# Patient Record
Sex: Female | Born: 1945
Health system: Southern US, Community
[De-identification: ages and names within clinical notes are randomized; demographics above are authoritative.]

## PROBLEM LIST (undated history)

## (undated) DIAGNOSIS — F329 Major depressive disorder, single episode, unspecified: Secondary | ICD-10-CM

## (undated) DIAGNOSIS — F32A Depression, unspecified: Secondary | ICD-10-CM

## (undated) DIAGNOSIS — I10 Essential (primary) hypertension: Secondary | ICD-10-CM

## (undated) DIAGNOSIS — E785 Hyperlipidemia, unspecified: Secondary | ICD-10-CM

## (undated) DIAGNOSIS — M1990 Unspecified osteoarthritis, unspecified site: Secondary | ICD-10-CM

## (undated) DIAGNOSIS — K219 Gastro-esophageal reflux disease without esophagitis: Secondary | ICD-10-CM

## (undated) DIAGNOSIS — Z8719 Personal history of other diseases of the digestive system: Secondary | ICD-10-CM

## (undated) HISTORY — DX: Depression, unspecified: F32.A

## (undated) HISTORY — DX: Essential (primary) hypertension: I10

## (undated) HISTORY — DX: Unspecified osteoarthritis, unspecified site: M19.90

## (undated) HISTORY — PX: CATARACT EXTRACTION, BILATERAL: SHX1313

## (undated) HISTORY — DX: Hyperlipidemia, unspecified: E78.5

## (undated) HISTORY — DX: Major depressive disorder, single episode, unspecified: F32.9

## (undated) HISTORY — DX: Personal history of other diseases of the digestive system: Z87.19

## (undated) HISTORY — DX: Gastro-esophageal reflux disease without esophagitis: K21.9

## (undated) HISTORY — PX: FOOT SURGERY: SHX648

---
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
## 1978-07-10 HISTORY — PX: GANGLION CYST EXCISION: SHX1691

## 1993-07-10 HISTORY — PX: BREAST BIOPSY: SHX20

## 1998-07-10 HISTORY — PX: KNEE ARTHROSCOPY: SUR90

## 2003-04-28 ENCOUNTER — Other Ambulatory Visit: Admission: RE | Admit: 2003-04-28 | Discharge: 2003-04-28 | Payer: Self-pay | Admitting: Radiology

## 2009-11-24 ENCOUNTER — Ambulatory Visit: Payer: Self-pay | Admitting: Internal Medicine

## 2013-04-14 ENCOUNTER — Ambulatory Visit: Payer: Self-pay | Admitting: Ophthalmology

## 2013-04-14 LAB — POTASSIUM: Potassium: 2.9 mmol/L — ABNORMAL LOW (ref 3.5–5.1)

## 2013-04-28 ENCOUNTER — Ambulatory Visit: Payer: Self-pay | Admitting: Ophthalmology

## 2013-07-01 ENCOUNTER — Ambulatory Visit: Payer: Self-pay | Admitting: Ophthalmology

## 2013-07-01 LAB — POTASSIUM: Potassium: 2.9 mmol/L — ABNORMAL LOW (ref 3.5–5.1)

## 2013-07-14 ENCOUNTER — Ambulatory Visit: Payer: Self-pay | Admitting: Ophthalmology

## 2013-07-14 LAB — POTASSIUM: Potassium: 3.8 mmol/L (ref 3.5–5.1)

## 2013-09-08 ENCOUNTER — Ambulatory Visit (INDEPENDENT_AMBULATORY_CARE_PROVIDER_SITE_OTHER): Payer: Medicare Other

## 2013-09-08 ENCOUNTER — Ambulatory Visit (INDEPENDENT_AMBULATORY_CARE_PROVIDER_SITE_OTHER): Payer: Medicare Other | Admitting: Podiatry

## 2013-09-08 ENCOUNTER — Encounter: Payer: Self-pay | Admitting: Podiatry

## 2013-09-08 VITALS — BP 114/68 | HR 96 | Resp 16 | Ht 62.0 in | Wt 150.0 lb

## 2013-09-08 DIAGNOSIS — M79609 Pain in unspecified limb: Secondary | ICD-10-CM

## 2013-09-08 DIAGNOSIS — M79673 Pain in unspecified foot: Secondary | ICD-10-CM

## 2013-09-08 DIAGNOSIS — M21619 Bunion of unspecified foot: Secondary | ICD-10-CM

## 2013-09-08 NOTE — Progress Notes (Signed)
   Subjective:    Patient ID: Christine Mullins, female    DOB: [birth year], 68 y.o.   MRN: [id number]  HPI Comments: i have a corn in between my 4th and 5th toes on my left foot. Ive had it off and on for the last 6 months. They are getting worse. Sometimes shoes will hurt. i have put some acid on my corn and and a pad on it.   Foot Pain      Review of Systems  All other systems reviewed and are negative.       Objective:   Physical Exam I have reviewed her past history medications allergies surgeries and social history. Pulses are strongly palpable bilateral. Neurologic sensorium is intact bilateral. Deep tendon reflexes are intact bilateral. Orthopedic evaluation Mr. is all joints distal ankle a full range of motion the crepitus Taylor's bunion deformity is positive. She also has adductovarus rotated hammertoe deformity fifth digit of the left foot that is irritating the lateral aspect of the fourth digit with a very tight sulcus. Reactive hyperkeratosis is noted the proximal lateral aspect of the fourth digit left foot with soft corn.        Assessment & Plan:  Assessment: Taylor's bunion deformity left foot soft corn between the fourth and fifth digits of the left foot.  Plan: Discussed etiology pathology conservative versus surgical therapies she was treated point from may for surgical consult in a repair.

## 2014-05-04 ENCOUNTER — Ambulatory Visit: Payer: Self-pay | Admitting: Unknown Physician Specialty

## 2014-09-01 DIAGNOSIS — R300 Dysuria: Secondary | ICD-10-CM | POA: Diagnosis not present

## 2014-09-01 DIAGNOSIS — R350 Frequency of micturition: Secondary | ICD-10-CM | POA: Diagnosis not present

## 2014-09-01 DIAGNOSIS — N390 Urinary tract infection, site not specified: Secondary | ICD-10-CM | POA: Diagnosis not present

## 2014-09-01 DIAGNOSIS — E876 Hypokalemia: Secondary | ICD-10-CM | POA: Diagnosis not present

## 2014-10-05 DIAGNOSIS — E876 Hypokalemia: Secondary | ICD-10-CM | POA: Diagnosis not present

## 2014-10-30 NOTE — Op Note (Signed)
PATIENT NAME:  Christine, Mullins MR#:  [id number] DATE OF BIRTH:  08/08/1945  DATE OF PROCEDURE:  04/28/2013  PREOPERATIVE DIAGNOSIS:  Cataract, left eye.    POSTOPERATIVE DIAGNOSIS:  Cataract, left eye.  PROCEDURE PERFORMED:  Extracapsular cataract extraction using phacoemulsification with placement of an Alcon SN6CWS, 18.0-diopter posterior chamber lens, serial # [id number].  SURGEON:  Loura Back. Harlem Bula, MD  ASSISTANT:  None.  ANESTHESIA:  4% lidocaine and 0.75% Marcaine in a 50/50 mixture with 10 units/mL of HyoMax added, given as a peribulbar.  ANESTHESIOLOGIST:  Dr. Benjamine Mola.  COMPLICATIONS:  None.  ESTIMATED BLOOD LOSS:  Less than 1 ml.  DESCRIPTION OF PROCEDURE:  The patient was brought to the operating room and given a peribulbar block.  The patient was then prepped and draped in the usual fashion.  The vertical rectus muscles were imbricated using 5-0 silk sutures.  These sutures were then clamped to the sterile drapes as bridle sutures.  A limbal peritomy was performed extending two clock hours and hemostasis was obtained with cautery.  A partial thickness scleral groove was made at the surgical limbus and dissected anteriorly in a lamellar dissection using an Alcon crescent knife.  The anterior chamber was entered supero-temporally with a Superblade and through the lamellar dissection with a 2.6 mm keratome.  DisCoVisc was used to replace the aqueous and a continuous tear capsulorrhexis was carried out.  Hydrodissection and hydrodelineation were carried out with balanced salt and a 27 gauge canula.  The nucleus was rotated to confirm the effectiveness of the hydrodissection.  Phacoemulsification was carried out using a divide-and-conquer technique.  Total ultrasound time was 1 minute and 11 seconds with an average power of 21.7%, CDE of 28.82.  Irrigation/aspiration was used to remove the residual cortex.  DisCoVisc was used to inflate the capsule and the internal incision was  enlarged to 3 mm with the crescent knife.  The intraocular lens was folded and inserted into the capsular bag using the Ocusert delivery system.  Irrigation/aspiration was used to remove the residual DisCoVisc.  Miostat was injected into the anterior chamber through the paracentesis tract to inflate the anterior chamber and induce miosis.  0.1% mL of cefuroxime was injected via the paracentesis tract containing 1 mg of drug.  The wound was checked for leaks and none were found. The conjunctiva was closed with cautery and the bridle sutures were removed.  Two drops of 0.3% Vigamox were placed on the eye.   An eye shield was placed on the eye.  The patient was discharged to the recovery room in good condition.   ____________________________ Loura Back Narek Kniss, MD sad:dmm D: 04/28/2013 12:27:10 ET T: 04/28/2013 13:11:34 ET JOB#: [id number]  cc: Remo Lipps A. Yaneliz Radebaugh, MD, <Dictator> Martie Lee MD ELECTRONICALLY SIGNED 05/05/2013 13:46

## 2014-10-31 NOTE — Op Note (Signed)
PATIENT NAME:  Christine Mullins, Christine Mullins MR#:  [id number] DATE OF BIRTH:  1946-04-23  DATE OF PROCEDURE:  07/14/2013  PREOPERATIVE DIAGNOSIS: Cataract, right eye.   POSTOPERATIVE DIAGNOSIS: Cataract, right eye.  PROCEDURE PERFORMED: Extracapsular cataract extraction using phacoemulsification with placement of Alcon SN6CWS, 18.5 diopter posterior chamber lens, serial number [id number].   SURGEON: Loura Back. Reilyn Nelson, M.D.   ANESTHESIA: 4% lidocaine and 0.75% Marcaine a 50-50 mixture with 10 units/mL of HyoMax added, given as a peribulbar.   ANESTHESIOLOGIST: Dr. Boston Service.   COMPLICATIONS: None.   ESTIMATED BLOOD LOSS: Less than 1 mL.   DESCRIPTION OF PROCEDURE: The patient was brought to the operating room and given IV sedation and a peribulbar block. She was then prepped and draped in the usual fashion. Vertical rectus muscles were imbricated using 5-0 silk sutures and bridle sutures. The vessels at the limbus were cauterized superiorly using a pencil cautery. A partial thickness scleral groove was made at the posterior surgical limbus and dissected anteriorly into clear cornea with an Alcon  crescent knife. The anterior chamber was entered superonasally through clear cornea with a paracentesis knife and through the lamellar dissection with a 2.6 mm keratome. DisCoVisc was used to place the aqueous and continuous tear circular capsulorrhexis was carried out. Hydrodissection was used to loosen the nucleus and phacoemulsification was carried out in divide and conquer technique. Ultrasound time was 1 minute and an average power of 20.3%, CDE of 19.56. Irrigation-aspiration was used to remove the residual cortex. The capsular bag was inflated with DisCoVisc and the intraocular lens was inserted into the capsular bag using a Librarian, academic. Irrigation-aspiration was used to remove the residual DisCoVisc. The wound was inflated with balanced salt and Miostat was injected through the paracentesis track. A  tenth of a mL of cefuroxime containing 1 mg of drug was injected into the anterior chamber through the paracentesis track. The wound was then checked for leaks. None were found. The bridle sutures were removed. Three drops of Vigamox were placed in the eye. A shield was placed on the eye and the patient was discharged to the recovery area in good condition.   ____________________________ Loura Back Christine Goodhart, Christine Mullins sad:aw D: 07/14/2013 11:51:11 ET T: 07/14/2013 11:59:07 ET JOB#: [id number]  cc: Remo Lipps A. Lyn Deemer, Christine Mullins, <Dictator> Martie Lee Christine Mullins ELECTRONICALLY SIGNED 07/21/2013 13:24

## 2014-12-28 DIAGNOSIS — R21 Rash and other nonspecific skin eruption: Secondary | ICD-10-CM | POA: Diagnosis not present

## 2014-12-28 DIAGNOSIS — L237 Allergic contact dermatitis due to plants, except food: Secondary | ICD-10-CM | POA: Diagnosis not present

## 2015-02-24 DIAGNOSIS — L720 Epidermal cyst: Secondary | ICD-10-CM | POA: Diagnosis not present

## 2015-02-24 DIAGNOSIS — R208 Other disturbances of skin sensation: Secondary | ICD-10-CM | POA: Diagnosis not present

## 2015-03-30 DIAGNOSIS — Z1231 Encounter for screening mammogram for malignant neoplasm of breast: Secondary | ICD-10-CM | POA: Diagnosis not present

## 2015-06-08 DIAGNOSIS — M818 Other osteoporosis without current pathological fracture: Secondary | ICD-10-CM | POA: Diagnosis not present

## 2015-06-08 DIAGNOSIS — R7309 Other abnormal glucose: Secondary | ICD-10-CM | POA: Diagnosis not present

## 2015-06-08 DIAGNOSIS — Z1239 Encounter for other screening for malignant neoplasm of breast: Secondary | ICD-10-CM | POA: Diagnosis not present

## 2015-06-08 DIAGNOSIS — E7800 Pure hypercholesterolemia, unspecified: Secondary | ICD-10-CM | POA: Diagnosis not present

## 2015-06-08 DIAGNOSIS — F5101 Primary insomnia: Secondary | ICD-10-CM | POA: Diagnosis not present

## 2015-06-08 DIAGNOSIS — Z0000 Encounter for general adult medical examination without abnormal findings: Secondary | ICD-10-CM | POA: Diagnosis not present

## 2015-06-08 DIAGNOSIS — E785 Hyperlipidemia, unspecified: Secondary | ICD-10-CM | POA: Diagnosis not present

## 2015-06-08 DIAGNOSIS — I10 Essential (primary) hypertension: Secondary | ICD-10-CM | POA: Diagnosis not present

## 2015-09-27 DIAGNOSIS — J069 Acute upper respiratory infection, unspecified: Secondary | ICD-10-CM | POA: Diagnosis not present

## 2015-09-27 DIAGNOSIS — B9789 Other viral agents as the cause of diseases classified elsewhere: Secondary | ICD-10-CM | POA: Diagnosis not present

## 2015-09-27 DIAGNOSIS — R0982 Postnasal drip: Secondary | ICD-10-CM | POA: Diagnosis not present

## 2015-10-01 ENCOUNTER — Ambulatory Visit: Payer: Self-pay | Admitting: Family Medicine

## 2015-10-06 ENCOUNTER — Ambulatory Visit: Payer: Self-pay | Admitting: Family Medicine

## 2015-10-11 ENCOUNTER — Ambulatory Visit: Payer: Self-pay | Admitting: Family Medicine

## 2016-02-04 DIAGNOSIS — Z961 Presence of intraocular lens: Secondary | ICD-10-CM | POA: Diagnosis not present

## 2016-02-16 DIAGNOSIS — I8393 Asymptomatic varicose veins of bilateral lower extremities: Secondary | ICD-10-CM | POA: Diagnosis not present

## 2016-02-16 DIAGNOSIS — L237 Allergic contact dermatitis due to plants, except food: Secondary | ICD-10-CM | POA: Diagnosis not present

## 2016-02-16 DIAGNOSIS — I781 Nevus, non-neoplastic: Secondary | ICD-10-CM | POA: Diagnosis not present

## 2016-02-16 DIAGNOSIS — L578 Other skin changes due to chronic exposure to nonionizing radiation: Secondary | ICD-10-CM | POA: Diagnosis not present

## 2016-02-16 DIAGNOSIS — D229 Melanocytic nevi, unspecified: Secondary | ICD-10-CM | POA: Diagnosis not present

## 2016-02-16 DIAGNOSIS — L812 Freckles: Secondary | ICD-10-CM | POA: Diagnosis not present

## 2016-02-16 DIAGNOSIS — L821 Other seborrheic keratosis: Secondary | ICD-10-CM | POA: Diagnosis not present

## 2016-02-16 DIAGNOSIS — Z1283 Encounter for screening for malignant neoplasm of skin: Secondary | ICD-10-CM | POA: Diagnosis not present

## 2016-02-16 DIAGNOSIS — D692 Other nonthrombocytopenic purpura: Secondary | ICD-10-CM | POA: Diagnosis not present

## 2016-02-16 DIAGNOSIS — Z85828 Personal history of other malignant neoplasm of skin: Secondary | ICD-10-CM | POA: Diagnosis not present

## 2016-03-22 DIAGNOSIS — M1711 Unilateral primary osteoarthritis, right knee: Secondary | ICD-10-CM | POA: Diagnosis not present

## 2016-03-22 DIAGNOSIS — M25561 Pain in right knee: Secondary | ICD-10-CM | POA: Diagnosis not present

## 2016-04-17 ENCOUNTER — Ambulatory Visit: Payer: Self-pay | Admitting: Internal Medicine

## 2016-04-24 ENCOUNTER — Encounter: Payer: Self-pay | Admitting: Internal Medicine

## 2016-04-24 ENCOUNTER — Ambulatory Visit (INDEPENDENT_AMBULATORY_CARE_PROVIDER_SITE_OTHER): Payer: Medicare Other | Admitting: Internal Medicine

## 2016-04-24 ENCOUNTER — Encounter (INDEPENDENT_AMBULATORY_CARE_PROVIDER_SITE_OTHER): Payer: Self-pay

## 2016-04-24 VITALS — BP 100/58 | HR 82 | Temp 98.0°F | Ht 62.5 in | Wt 153.8 lb

## 2016-04-24 DIAGNOSIS — K5730 Diverticulosis of large intestine without perforation or abscess without bleeding: Secondary | ICD-10-CM

## 2016-04-24 DIAGNOSIS — Z23 Encounter for immunization: Secondary | ICD-10-CM

## 2016-04-24 DIAGNOSIS — I10 Essential (primary) hypertension: Secondary | ICD-10-CM | POA: Diagnosis not present

## 2016-04-24 DIAGNOSIS — E7800 Pure hypercholesterolemia, unspecified: Secondary | ICD-10-CM

## 2016-04-24 DIAGNOSIS — Z1231 Encounter for screening mammogram for malignant neoplasm of breast: Secondary | ICD-10-CM | POA: Diagnosis not present

## 2016-04-24 DIAGNOSIS — M25561 Pain in right knee: Secondary | ICD-10-CM | POA: Diagnosis not present

## 2016-04-24 DIAGNOSIS — K219 Gastro-esophageal reflux disease without esophagitis: Secondary | ICD-10-CM

## 2016-04-24 DIAGNOSIS — Z1239 Encounter for other screening for malignant neoplasm of breast: Secondary | ICD-10-CM

## 2016-04-24 MED ORDER — OMEPRAZOLE 20 MG PO CPDR: 20.0000 mg | DELAYED_RELEASE_CAPSULE | Freq: Every day | ORAL | 3 refills | Status: DC

## 2016-04-24 MED ORDER — ZOLPIDEM TARTRATE 10 MG PO TABS: 10.0000 mg | ORAL_TABLET | Freq: Every evening | ORAL | 0 refills | Status: DC | PRN

## 2016-04-24 NOTE — Progress Notes (Signed)
Patient ID: Christine Mullins, female   DOB: [birth year], 70 y.o.   MRN: [id number]   Subjective:    Patient ID: Christine Mullins, female    DOB: 08-10-1945, 69 y.o.   MRN: [id number]  HPI  Patient here to establish care. Former pt of [facility].  States she was previously followed by Dr Randon Goldsmith and Dr Audie Pinto.  Was out of country for 15 years.  While in [place] in 2014 ad diverticulitis.  Had colonoscopy x 3 while there.  Had never had a flare prior to this.  Since these episodes has had two episodes of diarrhea, but nothing like what she experienced in [place].  Both recent episodes occurred while on a trip or after just returning from a trip.  Bowels ok now.  No abdominal pain or cramping.  No chest pain.  No sob.  Did see Dr Tami Ribas two years ago.  Had cough.  Was told had acid reflux.  Was placed on omeprazole.  Improved.  She does have some right knee pain.  This will limit her sometimes.  Saw Reche Dixon.  Takes mobic prn.  She previously lost 21 pounds with weight watchers.     Past Medical History:  Diagnosis Date  . Arthritis   . Depression   . GERD (gastroesophageal reflux disease)   . Hx of diverticulitis of colon   . Hyperlipidemia   . Hypertension    Past Surgical History:  Procedure Laterality Date  . BREAST BIOPSY  1995   negative  . CESAREAN SECTION    . FOOT SURGERY Bilateral    Family History  Problem Relation Age of Onset  . Arthritis Mother   . Hyperlipidemia Mother   . Hyperlipidemia Father   . Heart failure Father    Social History   Social History  . Marital status: Unknown    Spouse name: N/A  . Number of children: N/A  . Years of education: N/A   Social History Main Topics  . Smoking status: Never Smoker  . Smokeless tobacco: Never Used  . Alcohol use Yes     Comment: socail  . Drug use: Unknown  . Sexual activity: Not Asked   Other Topics Concern  . None   Social History Narrative  . None    Outpatient Encounter Prescriptions as of  04/24/2016  Medication Sig  . aspirin EC 81 MG tablet Take 81 mg by mouth daily.  . benazepril (LOTENSIN) 10 MG tablet   . POTASSIUM CITRATE PO Take by mouth 2 (two) times daily.  . simvastatin (ZOCOR) 40 MG tablet   . omeprazole (PRILOSEC) 20 MG capsule Take 1 capsule (20 mg total) by mouth daily.  Marland Kitchen zolpidem (AMBIEN) 10 MG tablet Take 1 tablet (10 mg total) by mouth at bedtime as needed for sleep.   No facility-administered encounter medications on file as of 04/24/2016.     Review of Systems  Constitutional: Negative for appetite change and unexpected weight change.  HENT: Negative for congestion and sinus pressure.   Respiratory: Negative for cough, chest tightness and shortness of breath.   Cardiovascular: Negative for chest pain, palpitations and leg swelling.  Gastrointestinal: Negative for abdominal pain, diarrhea, nausea and vomiting.  Genitourinary: Negative for difficulty urinating and dysuria.  Musculoskeletal: Negative for back pain.       Right knee pain as outlined.   Skin: Negative for color change and rash.  Neurological: Negative for dizziness, light-headedness and headaches.  Psychiatric/Behavioral: Negative for agitation and  dysphoric mood.       Objective:     Blood pressure rechecked by me:  118/64  Physical Exam  Constitutional: She appears well-developed and well-nourished. No distress.  HENT:  Nose: Nose normal.  Mouth/Throat: Oropharynx is clear and moist.  Neck: Neck supple. No thyromegaly present.  Cardiovascular: Normal rate and regular rhythm.   Pulmonary/Chest: Breath sounds normal. No respiratory distress. She has no wheezes.  Abdominal: Soft. Bowel sounds are normal. There is no tenderness.  Musculoskeletal: She exhibits no edema or tenderness.  Lymphadenopathy:    She has no cervical adenopathy.  Skin: No rash noted. No erythema.  Psychiatric: She has a normal mood and affect. Her behavior is normal.    BP (!) 100/58   Pulse 82   Temp  98 F (36.7 C) (Oral)   Ht 5' 2.5" (1.588 m)   Wt 153 lb 12.8 oz (69.8 kg)   SpO2 97%   BMI 27.68 kg/m  Wt Readings from Last 3 Encounters:  04/24/16 153 lb 12.8 oz (69.8 kg)  09/08/13 150 lb (68 kg)     Lab Results  Component Value Date   K 3.8 07/14/2013       Assessment & Plan:   Problem List Items Addressed This Visit    Diverticulosis of colon without hemorrhage    Had flare in 2014 while in [place].  Had colonoscopy in [place].  She has records.  Will get copy for me.        Essential hypertension    Blood pressure as outlined.  Same medication regimen.  Follow pressures.  Follow metabolic panel.        Relevant Medications   aspirin EC 81 MG tablet   Other Relevant Orders   CBC with Differential/Platelet   Basic metabolic panel   TSH   GERD (gastroesophageal reflux disease)    On omeprazole.  Symptoms controlled.  Follow.        Relevant Medications   omeprazole (PRILOSEC) 20 MG capsule   Hypercholesterolemia    Low cholesterol diet and exercise.  On simvastatin.  Follow lipid panel and liver function tests.        Relevant Medications   aspirin EC 81 MG tablet   Other Relevant Orders   Hepatic function panel   Lipid panel   Right knee pain    Saw ortho.  Was placed on mobic.  Try to limit amount of mobic.  Follow.         Other Visit Diagnoses    Breast cancer screening    -  Primary   Relevant Orders   MM DIGITAL SCREENING BILATERAL   Encounter for immunization       Relevant Medications   aspirin EC 81 MG tablet   POTASSIUM CITRATE PO   zolpidem (AMBIEN) 10 MG tablet   omeprazole (PRILOSEC) 20 MG capsule   Other Relevant Orders   MM DIGITAL SCREENING BILATERAL   Flu vaccine HIGH DOSE PF (Completed)     I spent 45 minutes with the patient and more than 50% of the time was spent in consultation regarding the above.  Time was spent going through her past medical history, current issues and coming up with treatment plans and f/u plans.      Einar Pheasant, MD

## 2016-04-24 NOTE — Progress Notes (Signed)
Pre visit review using our clinic review tool, if applicable. No additional management support is needed unless otherwise documented below in the visit note. 

## 2016-04-30 ENCOUNTER — Encounter: Payer: Self-pay | Admitting: Internal Medicine

## 2016-04-30 DIAGNOSIS — K219 Gastro-esophageal reflux disease without esophagitis: Secondary | ICD-10-CM | POA: Insufficient documentation

## 2016-04-30 DIAGNOSIS — K5730 Diverticulosis of large intestine without perforation or abscess without bleeding: Secondary | ICD-10-CM | POA: Insufficient documentation

## 2016-04-30 DIAGNOSIS — I10 Essential (primary) hypertension: Secondary | ICD-10-CM | POA: Insufficient documentation

## 2016-04-30 DIAGNOSIS — M25561 Pain in right knee: Secondary | ICD-10-CM | POA: Insufficient documentation

## 2016-04-30 DIAGNOSIS — E7800 Pure hypercholesterolemia, unspecified: Secondary | ICD-10-CM | POA: Insufficient documentation

## 2016-04-30 NOTE — Assessment & Plan Note (Signed)
Saw ortho.  Was placed on mobic.  Try to limit amount of mobic.  Follow.

## 2016-04-30 NOTE — Assessment & Plan Note (Signed)
Low cholesterol diet and exercise.  On simvastatin.  Follow lipid panel and liver function tests.

## 2016-04-30 NOTE — Assessment & Plan Note (Signed)
Blood pressure as outlined.  Same medication regimen.  Follow pressures.  Follow metabolic panel.  

## 2016-04-30 NOTE — Assessment & Plan Note (Signed)
On omeprazole.  Symptoms controlled.  Follow.

## 2016-04-30 NOTE — Assessment & Plan Note (Signed)
Had flare in 2014 while in [place].  Had colonoscopy in [place].  She has records.  Will get copy for me.

## 2016-05-01 ENCOUNTER — Other Ambulatory Visit: Payer: Federal, State, Local not specified - PPO

## 2016-05-05 ENCOUNTER — Other Ambulatory Visit (INDEPENDENT_AMBULATORY_CARE_PROVIDER_SITE_OTHER): Payer: Federal, State, Local not specified - PPO

## 2016-05-05 DIAGNOSIS — I10 Essential (primary) hypertension: Secondary | ICD-10-CM | POA: Diagnosis not present

## 2016-05-05 DIAGNOSIS — S8391XD Sprain of unspecified site of right knee, subsequent encounter: Secondary | ICD-10-CM | POA: Diagnosis not present

## 2016-05-05 DIAGNOSIS — E7800 Pure hypercholesterolemia, unspecified: Secondary | ICD-10-CM

## 2016-05-05 LAB — BASIC METABOLIC PANEL
BUN: 15 mg/dL (ref 6–23)
CALCIUM: 9.6 mg/dL (ref 8.4–10.5)
CO2: 28 meq/L (ref 19–32)
CREATININE: 0.72 mg/dL (ref 0.40–1.20)
Chloride: 104 mEq/L (ref 96–112)
GFR: 85.55 mL/min (ref >60.00)
GLUCOSE: 102 mg/dL — AB (ref 70–99)
Potassium: 3.7 mEq/L (ref 3.5–5.1)
Sodium: 140 mEq/L (ref 135–145)

## 2016-05-05 LAB — LIPID PANEL
Cholesterol: 117 mg/dL (ref 0–200)
HDL: 40.7 mg/dL (ref >39.00)
LDL Cholesterol: 56 mg/dL (ref 0–99)
NONHDL: 75.93
TRIGLYCERIDES: 99 mg/dL (ref 0.0–149.0)
Total CHOL/HDL Ratio: 3
VLDL: 19.8 mg/dL (ref 0.0–40.0)

## 2016-05-05 LAB — CBC WITH DIFFERENTIAL/PLATELET
BASOS ABS: 0 10*3/uL (ref 0.0–0.1)
BASOS PCT: 0.2 % (ref 0.0–3.0)
EOS ABS: 0.2 10*3/uL (ref 0.0–0.7)
Eosinophils Relative: 3.2 % (ref 0.0–5.0)
HEMATOCRIT: 41.3 % (ref 36.0–46.0)
Hemoglobin: 14.1 g/dL (ref 12.0–15.0)
LYMPHS ABS: 1.7 10*3/uL (ref 0.7–4.0)
Lymphocytes Relative: 30 % (ref 12.0–46.0)
MCHC: 34 g/dL (ref 30.0–36.0)
MCV: 87 fl (ref 78.0–100.0)
MONO ABS: 0.7 10*3/uL (ref 0.1–1.0)
Monocytes Relative: 11.9 % (ref 3.0–12.0)
NEUTROS ABS: 3.1 10*3/uL (ref 1.4–7.7)
Neutrophils Relative %: 54.7 % (ref 43.0–77.0)
Platelets: 287 10*3/uL (ref 150.0–400.0)
RBC: 4.75 Mil/uL (ref 3.87–5.11)
RDW: 14.5 % (ref 11.5–15.5)
WBC: 5.7 10*3/uL (ref 4.0–10.5)

## 2016-05-05 LAB — HEPATIC FUNCTION PANEL
ALBUMIN: 4.1 g/dL (ref 3.5–5.2)
ALK PHOS: 70 U/L (ref 39–117)
ALT: 18 U/L (ref 0–35)
AST: 21 U/L (ref 0–37)
Bilirubin, Direct: 0.1 mg/dL (ref 0.0–0.3)
TOTAL PROTEIN: 7.4 g/dL (ref 6.0–8.3)
Total Bilirubin: 0.5 mg/dL (ref 0.2–1.2)

## 2016-05-08 LAB — TSH: TSH: 0.77 u[IU]/mL (ref 0.35–4.50)

## 2016-05-09 ENCOUNTER — Telehealth: Payer: Self-pay | Admitting: *Deleted

## 2016-05-09 NOTE — Telephone Encounter (Signed)
Pt has requested lab results Pt contact [phone]

## 2016-05-26 ENCOUNTER — Ambulatory Visit
Admission: RE | Admit: 2016-05-26 | Discharge: 2016-05-26 | Disposition: A | Discharge status: Home / Self Care | Payer: Medicare Other | Source: Ambulatory Visit | Attending: Internal Medicine | Admitting: Internal Medicine

## 2016-05-26 DIAGNOSIS — Z1231 Encounter for screening mammogram for malignant neoplasm of breast: Secondary | ICD-10-CM | POA: Diagnosis not present

## 2016-05-26 DIAGNOSIS — Z1239 Encounter for other screening for malignant neoplasm of breast: Secondary | ICD-10-CM

## 2016-05-30 ENCOUNTER — Other Ambulatory Visit: Payer: Self-pay | Admitting: Internal Medicine

## 2016-06-05 ENCOUNTER — Other Ambulatory Visit: Payer: Self-pay | Admitting: *Deleted

## 2016-06-05 ENCOUNTER — Inpatient Hospital Stay
Admission: RE | Admit: 2016-06-05 | Discharge: 2016-06-05 | Disposition: A | Discharge status: Home / Self Care | Payer: Self-pay | Source: Ambulatory Visit | Attending: *Deleted | Admitting: *Deleted

## 2016-06-05 DIAGNOSIS — Z9289 Personal history of other medical treatment: Secondary | ICD-10-CM

## 2016-06-07 ENCOUNTER — Encounter: Payer: Self-pay | Admitting: Internal Medicine

## 2016-06-07 DIAGNOSIS — Z0000 Encounter for general adult medical examination without abnormal findings: Secondary | ICD-10-CM | POA: Insufficient documentation

## 2016-06-19 ENCOUNTER — Other Ambulatory Visit: Payer: Self-pay | Admitting: Internal Medicine

## 2016-06-19 NOTE — Telephone Encounter (Signed)
Looks like you haven't refill this medication before, would you like to send?

## 2016-06-20 ENCOUNTER — Other Ambulatory Visit: Payer: Self-pay | Admitting: Internal Medicine

## 2016-06-20 NOTE — Telephone Encounter (Signed)
ok'd rx for simvastatin #90 with no refills.

## 2016-06-20 NOTE — Telephone Encounter (Signed)
ok'd rx for ambien #30with no refills.

## 2016-06-20 NOTE — Telephone Encounter (Signed)
Last filled 04/24/16. Last OV 04/24/16. Has CPE on 06/27/16.

## 2016-06-21 NOTE — Telephone Encounter (Signed)
Rx faxed

## 2016-06-27 ENCOUNTER — Ambulatory Visit (INDEPENDENT_AMBULATORY_CARE_PROVIDER_SITE_OTHER): Payer: Medicare Other | Admitting: Internal Medicine

## 2016-06-27 ENCOUNTER — Encounter: Payer: Self-pay | Admitting: Internal Medicine

## 2016-06-27 DIAGNOSIS — K5730 Diverticulosis of large intestine without perforation or abscess without bleeding: Secondary | ICD-10-CM

## 2016-06-27 DIAGNOSIS — K219 Gastro-esophageal reflux disease without esophagitis: Secondary | ICD-10-CM | POA: Diagnosis not present

## 2016-06-27 DIAGNOSIS — M810 Age-related osteoporosis without current pathological fracture: Secondary | ICD-10-CM

## 2016-06-27 DIAGNOSIS — F419 Anxiety disorder, unspecified: Secondary | ICD-10-CM

## 2016-06-27 DIAGNOSIS — M25561 Pain in right knee: Secondary | ICD-10-CM | POA: Diagnosis not present

## 2016-06-27 DIAGNOSIS — E7800 Pure hypercholesterolemia, unspecified: Secondary | ICD-10-CM | POA: Diagnosis not present

## 2016-06-27 DIAGNOSIS — Z0000 Encounter for general adult medical examination without abnormal findings: Secondary | ICD-10-CM

## 2016-06-27 DIAGNOSIS — I10 Essential (primary) hypertension: Secondary | ICD-10-CM

## 2016-06-27 NOTE — Progress Notes (Signed)
Patient ID: Christine Mullins, female   DOB: Jan 09, 1946, 70 y.o.   MRN: [id number]   Subjective:    Patient ID: Christine Mullins, female    DOB: 1945/09/04, 70 y.o.   MRN: [id number]  HPI  Patient with past history of hypercholesterolemia, hypertension and GERD.  She comes in today to follow up on these issues as well as for a complete physical exam.  Doing weight watchers.  Has lost 25 pounds since 01/2016.  Previously had problems with diverticulitis.  See last note.  Had colonoscopy 2013 - diverticulosis, diverticulitis and colon polyp.  Last bad diverticular flare 2014.  No significant problems since.  Had some loose stool last night.  Feels fine today.  Tries to stay active.  No chest pain.  No sob.      Past Medical History:  Diagnosis Date  . Arthritis   . Depression   . GERD (gastroesophageal reflux disease)   . Hx of diverticulitis of colon   . Hyperlipidemia   . Hypertension    Past Surgical History:  Procedure Laterality Date  . BREAST BIOPSY Left 1995   negative  . CESAREAN SECTION    . FOOT SURGERY Bilateral    Family History  Problem Relation Age of Onset  . Arthritis Mother   . Hyperlipidemia Mother   . Hyperlipidemia Father   . Heart failure Father   . Breast cancer Neg Hx    Social History   Social History  . Marital status: Single    Spouse name: N/A  . Number of children: N/A  . Years of education: N/A   Social History Main Topics  . Smoking status: Never Smoker  . Smokeless tobacco: Never Used  . Alcohol use Yes     Comment: socail  . Drug use: Unknown  . Sexual activity: Not Asked   Other Topics Concern  . None   Social History Narrative  . None    Outpatient Encounter Prescriptions as of 06/27/2016  Medication Sig  . aspirin EC 81 MG tablet Take 81 mg by mouth daily.  . benazepril (LOTENSIN) 10 MG tablet TAKE 1 TABLET EVERY DAY  . escitalopram (LEXAPRO) 10 MG tablet TAKE 1 TABLET EVERY DAY  . hydrochlorothiazide (HYDRODIURIL) 25 MG tablet TAKE 1  TABLET EVERY DAY  . omeprazole (PRILOSEC) 20 MG capsule Take 1 capsule (20 mg total) by mouth daily.  Marland Kitchen POTASSIUM CITRATE PO Take by mouth 2 (two) times daily.  . simvastatin (ZOCOR) 40 MG tablet TAKE ONE TABLET EVERY EVENING  . zolpidem (AMBIEN) 10 MG tablet TAKE 1 TALET BY MOUTH AT BEDTIME AS NEEDED FOR SLEEP   No facility-administered encounter medications on file as of 06/27/2016.     Review of Systems  Constitutional: Negative for appetite change and unexpected weight change.  HENT: Negative for congestion and sinus pressure.   Eyes: Negative for pain and visual disturbance.  Respiratory: Negative for cough, chest tightness and shortness of breath.   Cardiovascular: Negative for chest pain, palpitations and leg swelling.  Gastrointestinal: Negative for abdominal pain, diarrhea, nausea and vomiting.  Genitourinary: Negative for difficulty urinating and dysuria.  Musculoskeletal: Negative for back pain and joint swelling.  Skin: Negative for color change and rash.  Neurological: Negative for dizziness, light-headedness and headaches.  Hematological: Negative for adenopathy. Does not bruise/bleed easily.  Psychiatric/Behavioral: Negative for agitation and dysphoric mood.       Objective:    Physical Exam  Constitutional: She is oriented to person, place, and  time. She appears well-developed and well-nourished. No distress.  HENT:  Nose: Nose normal.  Mouth/Throat: Oropharynx is clear and moist.  Eyes: Right eye exhibits no discharge. Left eye exhibits no discharge. No scleral icterus.  Neck: Neck supple. No thyromegaly present.  Cardiovascular: Normal rate and regular rhythm.   Pulmonary/Chest: Breath sounds normal. No accessory muscle usage. No tachypnea. No respiratory distress. She has no decreased breath sounds. She has no wheezes. She has no rhonchi. Right breast exhibits no inverted nipple, no mass, no nipple discharge and no tenderness (no axillary adenopathy). Left breast  exhibits no inverted nipple, no mass, no nipple discharge and no tenderness (no axilarry adenopathy).  Abdominal: Soft. Bowel sounds are normal. There is no tenderness.  Musculoskeletal: She exhibits no edema or tenderness.  Lymphadenopathy:    She has no cervical adenopathy.  Neurological: She is alert and oriented to person, place, and time.  Skin: Skin is warm. No rash noted. No erythema.  Psychiatric: She has a normal mood and affect. Her behavior is normal.    BP 110/72   Pulse 84   Temp 97.6 F (36.4 C) (Oral)   Ht '5\' 3"'$  (1.6 m)   Wt 150 lb 6.4 oz (68.2 kg)   SpO2 96%   BMI 26.64 kg/m  Wt Readings from Last 3 Encounters:  06/27/16 150 lb 6.4 oz (68.2 kg)  04/24/16 153 lb 12.8 oz (69.8 kg)  09/08/13 150 lb (68 kg)     Lab Results  Component Value Date   WBC 5.7 05/05/2016   HGB 14.1 05/05/2016   HCT 41.3 05/05/2016   PLT 287.0 05/05/2016   GLUCOSE 102 (H) 05/05/2016   CHOL 117 05/05/2016   TRIG 99.0 05/05/2016   HDL 40.70 05/05/2016   LDLCALC 56 05/05/2016   ALT 18 05/05/2016   AST 21 05/05/2016   NA 140 05/05/2016   K 3.7 05/05/2016   CL 104 05/05/2016   CREATININE 0.72 05/05/2016   BUN 15 05/05/2016   CO2 28 05/05/2016   TSH 0.77 05/05/2016    Mm Outside Films Mammo  Result Date: 06/05/2016 This examination belongs to an outside facility and is stored here for comparison purposes only.  Contact the originating outside institution for any associated report or interpretation.      Assessment & Plan:   Problem List Items Addressed This Visit    Anxiety    Controlled on lexapro.        Diverticulosis of colon without hemorrhage    Last bad flare in [facility] - 2014.  Colonoscopy as outlined.  Has been doing well.  Follow.  Notify me if flares.        Essential hypertension    Blood pressure under good control.  Continue same medication regimen.  Follow pressures.  Follow metabolic panel.        GERD (gastroesophageal reflux disease)    Controlled  on omeprazole.        Health care maintenance    Physical today 06/27/16.  Mammogram 06/06/16 - Birads I.  Colonoscopy 2013 - diverticulitis, diverticulosis and polyp.  PAP 2012 - negative with negative HPV.  03/23/14 - pap negative.       Hypercholesterolemia    On simvastatin.  Low cholesterol diet and exercise.  Follow lipid panel and liver function tests.        Osteoporosis    Bone density 2012 - documented.        Right knee pain    Saw Dr Joneen Boers  [facility].  S/p injection.  Better.           Einar Pheasant, MD

## 2016-07-10 ENCOUNTER — Encounter: Payer: Self-pay | Admitting: Internal Medicine

## 2016-07-10 DIAGNOSIS — M810 Age-related osteoporosis without current pathological fracture: Secondary | ICD-10-CM | POA: Insufficient documentation

## 2016-07-10 NOTE — Assessment & Plan Note (Signed)
Controlled on omeprazole.   

## 2016-07-10 NOTE — Assessment & Plan Note (Addendum)
Physical today 06/27/16.  Mammogram 06/06/16 - Birads I.  Colonoscopy 2013 - diverticulitis, diverticulosis and polyp.  PAP 2012 - negative with negative HPV.  03/23/14 - pap negative.

## 2016-07-10 NOTE — Assessment & Plan Note (Signed)
Blood pressure under good control.  Continue same medication regimen.  Follow pressures.  Follow metabolic panel.   

## 2016-07-10 NOTE — Assessment & Plan Note (Signed)
On simvastatin.  Low cholesterol diet and exercise.  Follow lipid panel and liver function tests.   

## 2016-07-10 NOTE — Assessment & Plan Note (Signed)
Last bad flare in [facility] - 2014.  Colonoscopy as outlined.  Has been doing well.  Follow.  Notify me if flares.

## 2016-07-10 NOTE — Assessment & Plan Note (Signed)
Controlled on lexapro 

## 2016-07-10 NOTE — Assessment & Plan Note (Signed)
Bone density 2012 - documented.

## 2016-07-10 NOTE — Assessment & Plan Note (Signed)
Saw Dr Leanor Kail.  S/p injection.  Better.

## 2016-07-18 ENCOUNTER — Other Ambulatory Visit: Payer: Self-pay | Admitting: Internal Medicine

## 2016-07-19 NOTE — Telephone Encounter (Signed)
Christine Mullins was last refilled on 06/20/16 #30, last OV 06/27/16. Ok to refill?

## 2016-07-20 NOTE — Telephone Encounter (Signed)
Do you know if this rx was faxed or do I need to call it in?

## 2016-08-02 DIAGNOSIS — Z961 Presence of intraocular lens: Secondary | ICD-10-CM | POA: Diagnosis not present

## 2016-08-08 ENCOUNTER — Telehealth: Payer: Self-pay | Admitting: Internal Medicine

## 2016-08-08 NOTE — Telephone Encounter (Signed)
Spoke with patient and scheduled appointment with Christine Paris, FNP tomorrow.

## 2016-08-08 NOTE — Telephone Encounter (Signed)
Pt called and stated that she has laryngitis, cough, cough so hard she had some blood, ear pain and runny nose. Pt does not think that she has the flu, believes it may be bronchitis. Pt would like some advise as far as what to do. Please advise, thank yo!  Call pt @ [phone]

## 2016-08-09 ENCOUNTER — Encounter: Payer: Self-pay | Admitting: Family

## 2016-08-09 ENCOUNTER — Ambulatory Visit (INDEPENDENT_AMBULATORY_CARE_PROVIDER_SITE_OTHER): Payer: Medicare Other | Admitting: Family

## 2016-08-09 VITALS — BP 110/68 | HR 98 | Temp 97.4°F | Ht 63.0 in | Wt 149.4 lb

## 2016-08-09 DIAGNOSIS — J040 Acute laryngitis: Secondary | ICD-10-CM

## 2016-08-09 MED ORDER — HYDROCODONE-HOMATROPINE 5-1.5 MG/5ML PO SYRP: 5.0000 mL | ORAL_SOLUTION | Freq: Every evening | ORAL | 0 refills | Status: DC | PRN

## 2016-08-09 MED ORDER — LIDOCAINE VISCOUS 2 % MT SOLN: 15.0000 mL | OROMUCOSAL | 0 refills | Status: DC | PRN

## 2016-08-09 NOTE — Progress Notes (Signed)
Pre visit review using our clinic review tool, if applicable. No additional management support is needed unless otherwise documented below in the visit note. 

## 2016-08-09 NOTE — Patient Instructions (Signed)
Viscous lidocaine   As discussed, suspect viral laryngitis; please let me know if not better.   Please take cough medication at night only as needed. As we discussed, I do not recommend dosing throughout the day as coughing is a protective mechanism . It also helps to break up thick mucous.  Do not take cough suppressants with alcohol as can lead to trouble breathing. Advise caution if taking cough suppressant and operating machinery ( i.e driving a car) as you may feel very tired.   Increase intake of clear fluids. Congestion is best treated by hydration, when mucus is wetter, it is thinner, less sticky, and easier to expel from the body, either through coughing up drainage, or by blowing your nose.   Get plenty of rest.   Use saline nasal drops and blow your nose frequently. Run a humidifier at night and elevate the head of the bed. Vicks Vapor rub will help with congestion and cough. Steam showers and sinus massage for congestion.   Use Acetaminophen or Ibuprofen as needed for fever or pain. Avoid second hand smoke. Even the smallest exposure will worsen symptoms.   Over the counter medications you can try include Delsym for cough, a decongestant for congestion, and Mucinex or Robitussin as an expectorant. Be sure to just get the plain Mucinex or Robitussin that just has one medication (Guaifenesen). We don't recommend the combination products. Note, be sure to drink two glasses of water with each dose of Mucinex as the medication will not work well without adequate hydration.   You can also try a teaspoon of honey to see if this will help reduce cough. Throat lozenges can sometimes be beneficial as well.    This illness will typically last 7 - 10 days.   Please follow up with our clinic if you develop a fever greater than 101 F, symptoms worsen, or do not resolve in the next week.

## 2016-08-09 NOTE — Progress Notes (Signed)
Subjective:    Patient ID: Christine Mullins, female    DOB: [birth year], 71 y.o.   MRN: [id number]  CC: Christine Mullins is a 71 y.o. female who presents today for an acute visit.    HPI: CC: loss her voice x 4 days, worsening. Endorses productive blood tinged sputum, cough, sore throat.   No SOB, wheezing, fever, chills.   Has been taking delsym with no relief.     HISTORY:  Past Medical History:  Diagnosis Date  . Arthritis   . Depression   . GERD (gastroesophageal reflux disease)   . Hx of diverticulitis of colon   . Hyperlipidemia   . Hypertension    Past Surgical History:  Procedure Laterality Date  . BREAST BIOPSY Left 1995   negative  . CESAREAN SECTION    . FOOT SURGERY Bilateral    Family History  Problem Relation Age of Onset  . Arthritis Mother   . Hyperlipidemia Mother   . Hyperlipidemia Father   . Heart failure Father   . Breast cancer Neg Hx     Allergies: Patient has no known allergies. Current Outpatient Prescriptions on File Prior to Visit  Medication Sig Dispense Refill  . aspirin EC 81 MG tablet Take 81 mg by mouth daily.    . benazepril (LOTENSIN) 10 MG tablet TAKE 1 TABLET EVERY DAY 90 tablet 1  . escitalopram (LEXAPRO) 10 MG tablet TAKE 1 TABLET EVERY DAY 90 tablet 1  . hydrochlorothiazide (HYDRODIURIL) 25 MG tablet TAKE 1 TABLET EVERY DAY 90 tablet 1  . omeprazole (PRILOSEC) 20 MG capsule Take 1 capsule (20 mg total) by mouth daily. 30 capsule 3  . simvastatin (ZOCOR) 40 MG tablet TAKE 1 TABLET BY MOUTH EVERY EVENING 90 tablet 0  . zolpidem (AMBIEN) 10 MG tablet TAKE 1 TABLET BY MOUTH AT BEDTIME AS NEEDED FOR SLEEP 30 tablet 0   No current facility-administered medications on file prior to visit.     Social History  Substance Use Topics  . Smoking status: Never Smoker  . Smokeless tobacco: Never Used  . Alcohol use Yes     Comment: socail    Review of Systems  Constitutional: Negative for chills and fever.  HENT: Positive for congestion,  sore throat and voice change. Negative for sinus pain and sinus pressure.   Respiratory: Positive for cough. Negative for shortness of breath and wheezing.   Cardiovascular: Negative for chest pain and palpitations.  Gastrointestinal: Negative for nausea and vomiting.      Objective:    BP 110/68   Pulse 98   Temp 97.4 F (36.3 C) (Oral)   Ht '5\' 3"'$  (1.6 m)   Wt 149 lb 6.4 oz (67.8 kg)   SpO2 97%   BMI 26.47 kg/m    Physical Exam  Constitutional: She appears well-developed and well-nourished.  HENT:  Head: Normocephalic and atraumatic.  Right Ear: Hearing, tympanic membrane, external ear and ear canal normal. No drainage, swelling or tenderness. No foreign bodies. Tympanic membrane is not erythematous and not bulging. No middle ear effusion. No decreased hearing is noted.  Left Ear: Hearing, tympanic membrane, external ear and ear canal normal. No drainage, swelling or tenderness. No foreign bodies. Tympanic membrane is not erythematous and not bulging.  No middle ear effusion. No decreased hearing is noted.  Nose: Nose normal. No rhinorrhea. Right sinus exhibits no maxillary sinus tenderness and no frontal sinus tenderness. Left sinus exhibits no maxillary sinus tenderness and no frontal sinus  tenderness.  Mouth/Throat: Uvula is midline and mucous membranes are normal. Posterior oropharyngeal erythema present. No oropharyngeal exudate, posterior oropharyngeal edema or tonsillar abscesses.  Eyes: Conjunctivae are normal.  Cardiovascular: Regular rhythm, normal heart sounds and normal pulses.   Pulmonary/Chest: Effort normal and breath sounds normal. She has no wheezes. She has no rhonchi. She has no rales.  Lymphadenopathy:       Head (right side): No submental, no submandibular, no tonsillar, no preauricular, no posterior auricular and no occipital adenopathy present.       Head (left side): No submental, no submandibular, no tonsillar, no preauricular, no posterior auricular and no  occipital adenopathy present.    She has no cervical adenopathy.  Neurological: She is alert.  Skin: Skin is warm and dry.  Psychiatric: She has a normal mood and affect. Her speech is normal and behavior is normal. Thought content normal.  Vitals reviewed.      Assessment & Plan:  1. Laryngitis Afebrile afebrile. No adventitious lung sounds. Patient and I discussed most likely viral etiology with duration of 4 days. She agreed with conservative management and delayed antibiotic use. When necessary cough medication. She will let me know if she's not better.  - HYDROcodone-homatropine (HYCODAN) 5-1.5 MG/5ML syrup; Take 5 mLs by mouth at bedtime as needed for cough.  Dispense: 120 mL; Refill: 0 - lidocaine (XYLOCAINE) 2 % solution; Use as directed 15 mLs in the mouth or throat every 3 (three) hours as needed for mouth pain (gargle; may spit or swallow).  Dispense: 100 mL; Refill: 0     I have discontinued Ms. Basista's POTASSIUM CITRATE PO. I am also having her start on HYDROcodone-homatropine and lidocaine. Additionally, I am having her maintain her aspirin EC, omeprazole, escitalopram, benazepril, hydrochlorothiazide, zolpidem, and simvastatin.   Meds ordered this encounter  Medications  . HYDROcodone-homatropine (HYCODAN) 5-1.5 MG/5ML syrup    Sig: Take 5 mLs by mouth at bedtime as needed for cough.    Dispense:  120 mL    Refill:  0    Order Specific Question:   Supervising Provider    Answer:   Deborra Medina L [[id number]]  . lidocaine (XYLOCAINE) 2 % solution    Sig: Use as directed 15 mLs in the mouth or throat every 3 (three) hours as needed for mouth pain (gargle; may spit or swallow).    Dispense:  100 mL    Refill:  0    Order Specific Question:   Supervising Provider    Answer:   Crecencio Mc [[id number]]    Return precautions given.   Risks, benefits, and alternatives of the medications and treatment plan prescribed today were discussed, and patient expressed understanding.    Education regarding symptom management and diagnosis given to patient on AVS.  Continue to follow with Einar Pheasant, MD for routine health maintenance.   [name] and I agreed with plan.   Mable Paris, FNP

## 2016-08-14 ENCOUNTER — Telehealth: Payer: Self-pay | Admitting: *Deleted

## 2016-08-14 NOTE — Telephone Encounter (Signed)
Pt has requested a update

## 2016-08-14 NOTE — Telephone Encounter (Signed)
Pt has requested a medication refill for lidocaine and hycodan. Pt stated that she continues to have a productive cough  [facility]

## 2016-08-15 ENCOUNTER — Other Ambulatory Visit: Payer: Self-pay | Admitting: Family

## 2016-08-15 ENCOUNTER — Telehealth: Payer: Self-pay | Admitting: *Deleted

## 2016-08-15 DIAGNOSIS — J040 Acute laryngitis: Secondary | ICD-10-CM

## 2016-08-15 MED ORDER — LIDOCAINE VISCOUS 2 % MT SOLN: 15.0000 mL | OROMUCOSAL | 0 refills | Status: DC | PRN

## 2016-08-15 MED ORDER — HYDROCODONE-HOMATROPINE 5-1.5 MG/5ML PO SYRP: 5.0000 mL | ORAL_SOLUTION | Freq: Every evening | ORAL | 0 refills | Status: DC | PRN

## 2016-08-15 NOTE — Telephone Encounter (Signed)
Pt has requested another round of cough medication and lidocaine . Pt currently have symptoms of congestion and cough, pt has a cough with green mucus Pt contact [phone]

## 2016-08-15 NOTE — Telephone Encounter (Signed)
Please advise. Pt was last seen on 08/09/16 by Arnett. She also sent a message yesterday with same request. (PCP: Einar Pheasant)

## 2016-08-15 NOTE — Telephone Encounter (Signed)
Call pt-  Ordered. If persists or patient has new symptoms, please let me know, she may need antibiotic

## 2016-08-15 NOTE — Telephone Encounter (Signed)
See other phone note for follow-up

## 2016-08-16 NOTE — Telephone Encounter (Signed)
Left message for patient to return call back.  

## 2016-08-18 NOTE — Telephone Encounter (Signed)
Patient has received medication and feels a little better now.

## 2016-09-28 DIAGNOSIS — M1711 Unilateral primary osteoarthritis, right knee: Secondary | ICD-10-CM | POA: Diagnosis not present

## 2016-09-28 DIAGNOSIS — M7541 Impingement syndrome of right shoulder: Secondary | ICD-10-CM | POA: Diagnosis not present

## 2016-10-16 ENCOUNTER — Telehealth: Payer: Self-pay | Admitting: Internal Medicine

## 2016-10-16 ENCOUNTER — Other Ambulatory Visit: Payer: Self-pay | Admitting: Internal Medicine

## 2016-10-16 MED ORDER — ZOLPIDEM TARTRATE 10 MG PO TABS: 10.0000 mg | ORAL_TABLET | Freq: Every evening | ORAL | 0 refills | Status: DC | PRN

## 2016-10-16 NOTE — Telephone Encounter (Signed)
Pt called regarding needing to speak to nurse about her Rx for zolpidem (AMBIEN) 10 MG tablet. Has no refill on it.   Pharmacy is [facility], [place]  Call pt @ [phone]. Thank you!

## 2016-10-16 NOTE — Telephone Encounter (Signed)
rx ok'd for ambien #30 with no refills.   

## 2016-10-16 NOTE — Telephone Encounter (Signed)
Medication: Ambien '10mg'$  Directions: 1 op qhs Last given: 07-19-16 #30 Number refills: 0 Last o/v: 06-27-16 Follow up: n/a

## 2016-10-17 NOTE — Telephone Encounter (Signed)
Faxed to pharmacy

## 2016-11-16 ENCOUNTER — Telehealth: Payer: Self-pay

## 2016-11-16 NOTE — Telephone Encounter (Signed)
Patient walked into the office, complaints of increased anxiety due to a wedding next week, quite ill family member, and her responsibility level has gone up.  She is not sleeping and is having a difficult time coping.  Having trouble organizing and no appetite.  She has lost 3 pounds in one week and having diarrhea.  I advised that we had no appts today, but that Dr. Nicki Reaper could see her tomorrow.  Scheduled for open appt tomorrow, last OV in chart was 06/27/16 with PCP has been seen acutely and on 4/9 was prescribed ambien.

## 2016-11-17 ENCOUNTER — Telehealth: Payer: Self-pay | Admitting: *Deleted

## 2016-11-17 ENCOUNTER — Encounter: Payer: Self-pay | Admitting: Internal Medicine

## 2016-11-17 ENCOUNTER — Ambulatory Visit (INDEPENDENT_AMBULATORY_CARE_PROVIDER_SITE_OTHER): Payer: Medicare Other | Admitting: Internal Medicine

## 2016-11-17 DIAGNOSIS — F419 Anxiety disorder, unspecified: Secondary | ICD-10-CM

## 2016-11-17 DIAGNOSIS — I10 Essential (primary) hypertension: Secondary | ICD-10-CM | POA: Diagnosis not present

## 2016-11-17 MED ORDER — ALPRAZOLAM 0.25 MG PO TABS: 0.2500 mg | ORAL_TABLET | Freq: Two times a day (BID) | ORAL | 0 refills | Status: DC | PRN

## 2016-11-17 NOTE — Progress Notes (Signed)
Patient ID: Christine Mullins, female   DOB: Nov 14, 1945, 71 y.o.   MRN: [id number]   Subjective:    Patient ID: Christine Mullins, female    DOB: 19-May-1946, 71 y.o.   MRN: [id number]  HPI  Patient here as a work in with concerns regarding increased stress and anxiety.  She reports that she is having to do a lot for her son's wedding.  The wedding is in [place].  Her future daughter-n-law's mother and grandmother are very sick.  She reports the stress has increased recently.  Feels needs something to help her calm down.  No chest pain.  She stays active.  When she walks and exercises, no chest pain or tightness.  Feels better.  No sob.  No acid reflux.  Has lost more weight.  Not eating well.  Not sleeping well.  Feels needs something to help her relax.  She is taking [place].  On lexapro.     Past Medical History:  Diagnosis Date  . Arthritis   . Depression   . GERD (gastroesophageal reflux disease)   . Hx of diverticulitis of colon   . Hyperlipidemia   . Hypertension    Past Surgical History:  Procedure Laterality Date  . BREAST BIOPSY Left 1995   negative  . CESAREAN SECTION    . FOOT SURGERY Bilateral    Family History  Problem Relation Age of Onset  . Arthritis Mother   . Hyperlipidemia Mother   . Hyperlipidemia Father   . Heart failure Father   . Breast cancer Neg Hx    Social History   Social History  . Marital status: Single    Spouse name: N/A  . Number of children: N/A  . Years of education: N/A   Social History Main Topics  . Smoking status: Never Smoker  . Smokeless tobacco: Never Used  . Alcohol use Yes     Comment: socail  . Drug use: Unknown  . Sexual activity: Not Asked   Other Topics Concern  . None   Social History Narrative  . None    Outpatient Encounter Prescriptions as of 11/17/2016  Medication Sig  . aspirin EC 81 MG tablet Take 81 mg by mouth daily.  . benazepril (LOTENSIN) 10 MG tablet TAKE 1 TABLET EVERY DAY  . escitalopram (LEXAPRO) 10 MG  tablet TAKE 1 TABLET EVERY DAY  . hydrochlorothiazide (HYDRODIURIL) 25 MG tablet TAKE 1 TABLET EVERY DAY  . omeprazole (PRILOSEC) 20 MG capsule Take 1 capsule (20 mg total) by mouth daily.  . simvastatin (ZOCOR) 40 MG tablet TAKE 1 TABLET BY MOUTH EVERY EVENING  . zolpidem (AMBIEN) 10 MG tablet Take 1 tablet (10 mg total) by mouth at bedtime as needed. for sleep  . ALPRAZolam (XANAX) 0.25 MG tablet Take 1 tablet (0.25 mg total) by mouth 2 (two) times daily as needed for anxiety.  . [DISCONTINUED] HYDROcodone-homatropine (HYCODAN) 5-1.5 MG/5ML syrup Take 5 mLs by mouth at bedtime as needed for cough.  . [DISCONTINUED] lidocaine (XYLOCAINE) 2 % solution Use as directed 15 mLs in the mouth or throat every 3 (three) hours as needed for mouth pain (gargle; may spit or swallow).   No facility-administered encounter medications on file as of 11/17/2016.     Review of Systems  Constitutional:       Some decreased appetite.  Has lost weight.    HENT: Negative for congestion and sinus pressure.   Respiratory: Negative for cough, chest tightness and shortness of breath.  Cardiovascular: Negative for chest pain, palpitations and leg swelling.  Gastrointestinal: Negative for abdominal pain, nausea and vomiting.       Some bowel change at times, which she attributes to increased stress.    Genitourinary: Negative for difficulty urinating and dysuria.  Musculoskeletal: Negative for back pain and joint swelling.  Skin: Negative for color change and rash.  Neurological: Negative for dizziness, light-headedness and headaches.  Psychiatric/Behavioral: Positive for sleep disturbance. Negative for dysphoric mood.       Increased stress as outlined.        Objective:     Blood pressure rechecked by me:  114/68  Physical Exam  Constitutional: She appears well-developed and well-nourished. No distress.  HENT:  Nose: Nose normal.  Mouth/Throat: Oropharynx is clear and moist.  Neck: Neck supple. No  thyromegaly present.  Cardiovascular: Normal rate and regular rhythm.   Pulmonary/Chest: Breath sounds normal. No respiratory distress. She has no wheezes.  Abdominal: Soft. Bowel sounds are normal. There is no tenderness.  Musculoskeletal: She exhibits no edema or tenderness.  Lymphadenopathy:    She has no cervical adenopathy.  Skin: No rash noted. No erythema.  Psychiatric: She has a normal mood and affect. Her behavior is normal.    BP 114/68   Pulse 76   Temp 98.4 F (36.9 C) (Oral)   Resp 12   Ht '5\' 2"'$  (1.575 m)   Wt 140 lb 6.4 oz (63.7 kg)   SpO2 98%   BMI 25.68 kg/m  Wt Readings from Last 3 Encounters:  11/17/16 140 lb 6.4 oz (63.7 kg)  08/09/16 149 lb 6.4 oz (67.8 kg)  06/27/16 150 lb 6.4 oz (68.2 kg)     Lab Results  Component Value Date   WBC 5.7 05/05/2016   HGB 14.1 05/05/2016   HCT 41.3 05/05/2016   PLT 287.0 05/05/2016   GLUCOSE 102 (H) 05/05/2016   CHOL 117 05/05/2016   TRIG 99.0 05/05/2016   HDL 40.70 05/05/2016   LDLCALC 56 05/05/2016   ALT 18 05/05/2016   AST 21 05/05/2016   NA 140 05/05/2016   K 3.7 05/05/2016   CL 104 05/05/2016   CREATININE 0.72 05/05/2016   BUN 15 05/05/2016   CO2 28 05/05/2016   TSH 0.77 05/05/2016    Mm Outside Films Mammo  Result Date: 06/05/2016 This examination belongs to an outside facility and is stored here for comparison purposes only.  Contact the originating outside institution for any associated report or interpretation.      Assessment & Plan:   Problem List Items Addressed This Visit    Anxiety    Increased stress and anxiety as outlined.  Some sleep disturbance as outlined.  Has been trying [place] recently.  Continue lexapro.  States feels better after talking today.  Xanax as directed.  Discussed xanax and proper way to take the medication.  Do not take with ambien.  Follow.        Relevant Medications   ALPRAZolam (XANAX) 0.25 MG tablet   Essential hypertension    Blood pressure on recheck  improved.  Follow.            Einar Pheasant, MD

## 2016-11-17 NOTE — Telephone Encounter (Signed)
Patient will need a follow up in 8 weeks -thanks  Pt contact [phone]

## 2016-11-17 NOTE — Progress Notes (Signed)
Pre-visit discussion using our clinic review tool. No additional management support is needed unless otherwise documented below in the visit note.  

## 2016-11-18 ENCOUNTER — Encounter: Payer: Self-pay | Admitting: Internal Medicine

## 2016-11-18 NOTE — Assessment & Plan Note (Signed)
Blood pressure on recheck improved.  Follow.

## 2016-11-18 NOTE — Assessment & Plan Note (Signed)
Increased stress and anxiety as outlined.  Some sleep disturbance as outlined.  Has been trying [place] recently.  Continue lexapro.  States feels better after talking today.  Xanax as directed.  Discussed xanax and proper way to take the medication.  Do not take with ambien.  Follow.

## 2016-11-24 NOTE — Telephone Encounter (Signed)
Lm on vm to schedule an appt around 01/19/17

## 2016-11-29 NOTE — Telephone Encounter (Signed)
Lm for pt to call back and schedule.

## 2016-11-29 NOTE — Telephone Encounter (Signed)
Patient has requested a call to schedule  [phone]

## 2016-12-01 ENCOUNTER — Other Ambulatory Visit: Payer: Self-pay | Admitting: Internal Medicine

## 2016-12-28 ENCOUNTER — Telehealth: Payer: Self-pay | Admitting: Internal Medicine

## 2017-01-05 ENCOUNTER — Telehealth: Payer: Self-pay | Admitting: Internal Medicine

## 2017-01-05 MED ORDER — ZOLPIDEM TARTRATE 10 MG PO TABS: 10.0000 mg | ORAL_TABLET | Freq: Every evening | ORAL | 0 refills | Status: DC | PRN

## 2017-01-05 NOTE — Telephone Encounter (Signed)
ok'd ambien #30 with no refills.   

## 2017-01-05 NOTE — Telephone Encounter (Signed)
F/u 02/23/17 Last fill 11/17/16 #30 no refill

## 2017-01-05 NOTE — Telephone Encounter (Signed)
Patient needing a refill on her zolpidem (AMBIEN) 10 MG tablet

## 2017-01-08 NOTE — Telephone Encounter (Signed)
Faxed to pharmacy

## 2017-02-06 DIAGNOSIS — H43822 Vitreomacular adhesion, left eye: Secondary | ICD-10-CM | POA: Diagnosis not present

## 2017-02-19 DIAGNOSIS — D1800 Hemangioma unspecified site: Secondary | ICD-10-CM | POA: Diagnosis not present

## 2017-02-19 DIAGNOSIS — D229 Melanocytic nevi, unspecified: Secondary | ICD-10-CM | POA: Diagnosis not present

## 2017-02-19 DIAGNOSIS — L578 Other skin changes due to chronic exposure to nonionizing radiation: Secondary | ICD-10-CM | POA: Diagnosis not present

## 2017-02-19 DIAGNOSIS — D692 Other nonthrombocytopenic purpura: Secondary | ICD-10-CM | POA: Diagnosis not present

## 2017-02-19 DIAGNOSIS — L821 Other seborrheic keratosis: Secondary | ICD-10-CM | POA: Diagnosis not present

## 2017-02-19 DIAGNOSIS — Z85828 Personal history of other malignant neoplasm of skin: Secondary | ICD-10-CM | POA: Diagnosis not present

## 2017-02-19 DIAGNOSIS — L812 Freckles: Secondary | ICD-10-CM | POA: Diagnosis not present

## 2017-02-23 ENCOUNTER — Encounter: Payer: Self-pay | Admitting: Internal Medicine

## 2017-02-23 ENCOUNTER — Ambulatory Visit (INDEPENDENT_AMBULATORY_CARE_PROVIDER_SITE_OTHER): Payer: Medicare Other | Admitting: Internal Medicine

## 2017-02-23 DIAGNOSIS — E7800 Pure hypercholesterolemia, unspecified: Secondary | ICD-10-CM | POA: Diagnosis not present

## 2017-02-23 DIAGNOSIS — F419 Anxiety disorder, unspecified: Secondary | ICD-10-CM

## 2017-02-23 DIAGNOSIS — I10 Essential (primary) hypertension: Secondary | ICD-10-CM

## 2017-02-23 DIAGNOSIS — R1030 Lower abdominal pain, unspecified: Secondary | ICD-10-CM

## 2017-02-23 DIAGNOSIS — K219 Gastro-esophageal reflux disease without esophagitis: Secondary | ICD-10-CM

## 2017-02-23 MED ORDER — ZOLPIDEM TARTRATE 10 MG PO TABS: 10.0000 mg | ORAL_TABLET | Freq: Every evening | ORAL | 0 refills | Status: DC | PRN

## 2017-02-23 NOTE — Progress Notes (Signed)
Patient ID: Christine Mullins, female   DOB: 05/16/1946, 71 y.o.   MRN: [id number]   Subjective:    Patient ID: Christine Mullins, female    DOB: 11/04/1945, 71 y.o.   MRN: [id number]  HPI  Patient here for a scheduled follow up.  She reports having some persistent problems with nausea.  States has been a persistent intermittent problem over this summer.  No bowel change.  No diarrhea.  Reports some persistent left lower abdominal/pelvic discomfort.  Increased gas.  Describes her stomach - being hard - intermittent.  She is eating.  No vomiting.  Some previous cough.  Resolved with omeprazole.  No chest pain.  No sob.     Past Medical History:  Diagnosis Date  . Arthritis   . Depression   . GERD (gastroesophageal reflux disease)   . Hx of diverticulitis of colon   . Hyperlipidemia   . Hypertension    Past Surgical History:  Procedure Laterality Date  . BREAST BIOPSY Left 1995   negative  . CESAREAN SECTION    . FOOT SURGERY Bilateral    Family History  Problem Relation Age of Onset  . Arthritis Mother   . Hyperlipidemia Mother   . Hyperlipidemia Father   . Heart failure Father   . Breast cancer Neg Hx    Social History   Social History  . Marital status: Single    Spouse name: N/A  . Number of children: N/A  . Years of education: N/A   Social History Main Topics  . Smoking status: Never Smoker  . Smokeless tobacco: Never Used  . Alcohol use Yes     Comment: socail  . Drug use: Unknown  . Sexual activity: Not Asked   Other Topics Concern  . None   Social History Narrative  . None    Outpatient Encounter Prescriptions as of 02/23/2017  Medication Sig  . ALPRAZolam (XANAX) 0.25 MG tablet Take 1 tablet (0.25 mg total) by mouth 2 (two) times daily as needed for anxiety.  Marland Kitchen aspirin EC 81 MG tablet Take 81 mg by mouth daily.  . benazepril (LOTENSIN) 10 MG tablet TAKE 1 TALBET BY MOUTH DAILY  . escitalopram (LEXAPRO) 10 MG tablet TAKE 1 TABLET BY MOUTH DAILY  .  hydrochlorothiazide (HYDRODIURIL) 25 MG tablet TAKE 1 TABLET BY MOUTH DAILY  . omeprazole (PRILOSEC) 20 MG capsule Take 1 capsule (20 mg total) by mouth daily.  . simvastatin (ZOCOR) 40 MG tablet TAKE 1 TABLET BY MOUTH EVERY EVENING  . zolpidem (AMBIEN) 10 MG tablet Take 1 tablet (10 mg total) by mouth at bedtime as needed. for sleep  . [DISCONTINUED] simvastatin (ZOCOR) 40 MG tablet TAKE 1 TABLET BY MOUTH EVERY EVENING  . [DISCONTINUED] zolpidem (AMBIEN) 10 MG tablet Take 1 tablet (10 mg total) by mouth at bedtime as needed. for sleep   No facility-administered encounter medications on file as of 02/23/2017.     Review of Systems  Constitutional: Negative for appetite change and unexpected weight change.  HENT: Negative for congestion and sinus pressure.   Respiratory: Negative for cough, chest tightness and shortness of breath.   Cardiovascular: Negative for chest pain, palpitations and leg swelling.  Gastrointestinal: Negative for diarrhea, nausea and vomiting.       Left lower abdominal/pelvic pain.    Genitourinary: Negative for difficulty urinating and dysuria.  Musculoskeletal: Negative for back pain and joint swelling.  Skin: Negative for color change and rash.  Neurological: Negative for dizziness, light-headedness  and headaches.  Psychiatric/Behavioral: Negative for agitation and dysphoric mood.       Objective:     Blood pressure rechecked by me:  114/68  Physical Exam  Constitutional: She appears well-developed and well-nourished. No distress.  HENT:  Nose: Nose normal.  Mouth/Throat: Oropharynx is clear and moist.  Neck: Neck supple. No thyromegaly present.  Cardiovascular: Normal rate and regular rhythm.   Pulmonary/Chest: Breath sounds normal. No respiratory distress. She has no wheezes.  Abdominal: Soft. Bowel sounds are normal. There is no tenderness.  Musculoskeletal: She exhibits no edema or tenderness.  Lymphadenopathy:    She has no cervical adenopathy.    Skin: No rash noted. No erythema.  Psychiatric: She has a normal mood and affect. Her behavior is normal.    BP 136/74 (BP Location: Left Arm, Patient Position: Sitting, Cuff Size: Normal)   Pulse 80   Temp 98.6 F (37 C) (Oral)   Resp 12   Ht 5\' 2"  (1.575 m)   Wt 143 lb (64.9 kg)   SpO2 98%   BMI 26.16 kg/m  Wt Readings from Last 3 Encounters:  02/23/17 143 lb (64.9 kg)  11/17/16 140 lb 6.4 oz (63.7 kg)  08/09/16 149 lb 6.4 oz (67.8 kg)     Lab Results  Component Value Date   WBC 5.7 05/05/2016   HGB 14.1 05/05/2016   HCT 41.3 05/05/2016   PLT 287.0 05/05/2016   GLUCOSE 102 (H) 05/05/2016   CHOL 117 05/05/2016   TRIG 99.0 05/05/2016   HDL 40.70 05/05/2016   LDLCALC 56 05/05/2016   ALT 18 05/05/2016   AST 21 05/05/2016   NA 140 05/05/2016   K 3.7 05/05/2016   CL 104 05/05/2016   CREATININE 0.72 05/05/2016   BUN 15 05/05/2016   CO2 28 05/05/2016   TSH 0.77 05/05/2016    Mm Outside Films Mammo  Result Date: 06/05/2016 This examination belongs to an outside facility and is stored here for comparison purposes only.  Contact the originating outside institution for any associated report or interpretation.      Assessment & Plan:   Problem List Items Addressed This Visit    Anxiety    Doing much better.  Not requiring xanax.  Follow.        Essential hypertension    Blood pressure under good control.  Continue same medication regimen.  Follow pressures.  Follow metabolic panel.        Relevant Orders   Basic metabolic panel   GERD (gastroesophageal reflux disease)    Controlled on omeprazole.        Hypercholesterolemia    On simvastatin.  Low cholesterol diet and exercise.  Follow lipid panel and liver function tests.        Relevant Orders   Hepatic function panel   Lipid panel   Lower abdominal pain    Persistent nausea and left lower abdominal pain.  On omeprazole.  Schedule pelvic ultrasound.  Refer to GI.        Relevant Orders   US  Transvaginal Non-OB   Ambulatory referral to Gastroenterology       Einar Pheasant, MD

## 2017-02-23 NOTE — Progress Notes (Signed)
Pre-visit discussion using our clinic review tool. No additional management support is needed unless otherwise documented below in the visit note.  

## 2017-02-25 ENCOUNTER — Encounter: Payer: Self-pay | Admitting: Internal Medicine

## 2017-02-25 DIAGNOSIS — R1030 Lower abdominal pain, unspecified: Secondary | ICD-10-CM | POA: Insufficient documentation

## 2017-02-25 NOTE — Assessment & Plan Note (Signed)
Persistent nausea and left lower abdominal pain.  On omeprazole.  Schedule pelvic ultrasound.  Refer to GI.

## 2017-02-25 NOTE — Assessment & Plan Note (Signed)
On simvastatin.  Low cholesterol diet and exercise.  Follow lipid panel and liver function tests.   

## 2017-02-25 NOTE — Assessment & Plan Note (Signed)
Controlled on omeprazole.   

## 2017-02-25 NOTE — Assessment & Plan Note (Signed)
Doing much better.  Not requiring xanax.  Follow.

## 2017-02-25 NOTE — Assessment & Plan Note (Signed)
Blood pressure under good control.  Continue same medication regimen.  Follow pressures.  Follow metabolic panel.   

## 2017-02-28 ENCOUNTER — Other Ambulatory Visit: Payer: Medicare Other

## 2017-03-01 ENCOUNTER — Other Ambulatory Visit (INDEPENDENT_AMBULATORY_CARE_PROVIDER_SITE_OTHER): Payer: Medicare Other

## 2017-03-01 DIAGNOSIS — E7800 Pure hypercholesterolemia, unspecified: Secondary | ICD-10-CM

## 2017-03-01 DIAGNOSIS — I10 Essential (primary) hypertension: Secondary | ICD-10-CM | POA: Diagnosis not present

## 2017-03-01 LAB — LIPID PANEL
CHOL/HDL RATIO: 3
Cholesterol: 103 mg/dL (ref 0–200)
HDL: 34.2 mg/dL — AB (ref >39.00)
LDL Cholesterol: 58 mg/dL (ref 0–99)
NONHDL: 69.23
Triglycerides: 58 mg/dL (ref 0.0–149.0)
VLDL: 11.6 mg/dL (ref 0.0–40.0)

## 2017-03-01 LAB — BASIC METABOLIC PANEL
BUN: 14 mg/dL (ref 6–23)
CALCIUM: 9.2 mg/dL (ref 8.4–10.5)
CHLORIDE: 101 meq/L (ref 96–112)
CO2: 34 meq/L — AB (ref 19–32)
CREATININE: 0.73 mg/dL (ref 0.40–1.20)
GFR: 83.99 mL/min (ref >60.00)
Glucose, Bld: 100 mg/dL — ABNORMAL HIGH (ref 70–99)
Potassium: 3.7 mEq/L (ref 3.5–5.1)
SODIUM: 140 meq/L (ref 135–145)

## 2017-03-01 LAB — HEPATIC FUNCTION PANEL
ALBUMIN: 3.8 g/dL (ref 3.5–5.2)
ALK PHOS: 65 U/L (ref 39–117)
ALT: 22 U/L (ref 0–35)
AST: 23 U/L (ref 0–37)
BILIRUBIN DIRECT: 0.1 mg/dL (ref 0.0–0.3)
TOTAL PROTEIN: 7 g/dL (ref 6.0–8.3)
Total Bilirubin: 0.4 mg/dL (ref 0.2–1.2)

## 2017-03-02 ENCOUNTER — Telehealth: Payer: Self-pay | Admitting: Internal Medicine

## 2017-03-02 DIAGNOSIS — R1030 Lower abdominal pain, unspecified: Secondary | ICD-10-CM

## 2017-03-02 NOTE — Telephone Encounter (Signed)
See lab results for further documentation.  

## 2017-03-02 NOTE — Telephone Encounter (Signed)
error 

## 2017-03-02 NOTE — Telephone Encounter (Signed)
Pt called back returning your call. Please advise, thank you!  Call pt @3  [phone]

## 2017-03-08 ENCOUNTER — Ambulatory Visit
Admission: RE | Admit: 2017-03-08 | Discharge: 2017-03-08 | Disposition: A | Discharge status: Home / Self Care | Payer: Medicare Other | Source: Ambulatory Visit | Attending: Internal Medicine | Admitting: Internal Medicine

## 2017-03-08 DIAGNOSIS — R1030 Lower abdominal pain, unspecified: Secondary | ICD-10-CM | POA: Diagnosis not present

## 2017-03-08 DIAGNOSIS — R938 Abnormal findings on diagnostic imaging of other specified body structures: Secondary | ICD-10-CM | POA: Diagnosis not present

## 2017-03-12 ENCOUNTER — Other Ambulatory Visit: Payer: Self-pay | Admitting: Internal Medicine

## 2017-03-12 DIAGNOSIS — R9389 Abnormal findings on diagnostic imaging of other specified body structures: Secondary | ICD-10-CM

## 2017-03-12 DIAGNOSIS — R1030 Lower abdominal pain, unspecified: Secondary | ICD-10-CM

## 2017-03-12 NOTE — Progress Notes (Signed)
Order placed for gyn referral.  

## 2017-04-02 ENCOUNTER — Other Ambulatory Visit: Payer: Self-pay

## 2017-04-02 ENCOUNTER — Encounter (INDEPENDENT_AMBULATORY_CARE_PROVIDER_SITE_OTHER): Payer: Self-pay

## 2017-04-02 ENCOUNTER — Other Ambulatory Visit: Payer: Self-pay | Admitting: Internal Medicine

## 2017-04-02 ENCOUNTER — Ambulatory Visit (INDEPENDENT_AMBULATORY_CARE_PROVIDER_SITE_OTHER): Payer: Medicare Other | Admitting: Gastroenterology

## 2017-04-02 VITALS — BP 110/72 | HR 77 | Temp 97.7°F | Ht 62.0 in | Wt 143.6 lb

## 2017-04-02 DIAGNOSIS — K5732 Diverticulitis of large intestine without perforation or abscess without bleeding: Secondary | ICD-10-CM

## 2017-04-02 DIAGNOSIS — R1032 Left lower quadrant pain: Secondary | ICD-10-CM

## 2017-04-02 MED ORDER — METRONIDAZOLE 500 MG PO TABS: 500.0000 mg | ORAL_TABLET | Freq: Two times a day (BID) | ORAL | 0 refills | Status: AC

## 2017-04-02 MED ORDER — CIPROFLOXACIN HCL 500 MG PO TABS: 500.0000 mg | ORAL_TABLET | Freq: Two times a day (BID) | ORAL | 0 refills | Status: DC

## 2017-04-02 NOTE — Progress Notes (Signed)
Christine Darby, MD [address]  [facility]  [place], [place] [address]  Main: [phone]  Fax: [phone]    Gastroenterology Consultation  Referring Provider:     Einar Pheasant, MD Primary Care Physician:  Einar Pheasant, MD Primary Gastroenterologist:  Dr. Cephas Mullins Reason for Consultation:     Lower abdominal pain        HPI:   Christine Mullins is a 71 y.o. y/o female referred by Dr. Einar Pheasant, MD  for consultation & management of lower abdominal pain.  Initial attack of diverticulitis in 11/2012 when she was in [place], collapsed, was hospitalized for 4days. After this, she moved to [place] and continues to have mild intermittent episodes of left lower abdominal pain, localized, not a/w n/v/d/f/c/rectal bleeding. Occurs at night and followed by relief of pain next day early morning after a good BM. These attacks occur about once a month. She is currently taking probiotic recommended by her PCP, she doesn't recollect the name. She has chronic cough, controlled on daily omeprazole She has intentionally lost weight with diet and exercise  She is currently pain free   GI Procedures:  Colonoscopy 02/19/2001 Pandiverticulosis, normal colon otherwise Colonoscopy 06/12/2012 in [place] Technically challenging procedure. The sigmoid colon is fixed, narrowed and painful to traverse with an area of diverticular colitis within extensive diverticular disease. Extensive right and left-sided diverticulosis. Solitary ulcer overlying the IC valve biopsied. Tiny sessile polyp of the distal ascending colon removed by cold biopsy. The TI was intubated for at least 15 cm and no mucosal pathology identified. Ileal biopsies sent for histology. Pathology report not available  Past Medical History:  Diagnosis Date  . Arthritis   . Depression   . GERD (gastroesophageal reflux disease)   . Hx of diverticulitis of colon   . Hyperlipidemia   . Hypertension     Past Surgical History:    Procedure Laterality Date  . BREAST BIOPSY Left 1995   negative  . CESAREAN SECTION    . FOOT SURGERY Bilateral     Prior to Admission medications   Medication Sig Start Date End Date Taking? Authorizing Provider  ALPRAZolam (XANAX) 0.25 MG tablet Take 1 tablet (0.25 mg total) by mouth 2 (two) times daily as needed for anxiety. 11/17/16   Einar Pheasant, MD  aspirin EC 81 MG tablet Take 81 mg by mouth daily.    [provider]  benazepril (LOTENSIN) 10 MG tablet TAKE 1 TALBET BY MOUTH DAILY 12/01/16   Einar Pheasant, MD  escitalopram (LEXAPRO) 10 MG tablet TAKE 1 TABLET BY MOUTH DAILY 12/01/16   Einar Pheasant, MD  hydrochlorothiazide (HYDRODIURIL) 25 MG tablet TAKE 1 TABLET BY MOUTH DAILY 12/01/16   Einar Pheasant, MD  hydrOXYzine (ATARAX/VISTARIL) 25 MG tablet  03/05/17   [provider]  omeprazole (PRILOSEC) 20 MG capsule Take 1 capsule (20 mg total) by mouth daily. 04/24/16   Einar Pheasant, MD  simvastatin (ZOCOR) 40 MG tablet TAKE 1 TABLET BY MOUTH EVERY EVENING 07/19/16   Einar Pheasant, MD  zolpidem (AMBIEN) 10 MG tablet Take 1 tablet (10 mg total) by mouth at bedtime as needed. for sleep 02/23/17   Einar Pheasant, MD    Family History  Problem Relation Age of Onset  . Arthritis Mother   . Hyperlipidemia Mother   . Hyperlipidemia Father   . Heart failure Father   . Breast cancer Neg Hx      Social History  Substance Use Topics  .  Smoking status: Never Smoker  . Smokeless tobacco: Never Used  . Alcohol use Yes     Comment: socail    Allergies as of 04/02/2017  . (No Known Allergies)    Review of Systems:    All systems reviewed and negative except where noted in HPI.   Physical Exam:  BP 110/72   Pulse 77   Temp 97.7 F (36.5 C) (Oral)   Ht 5\' 2"  (1.575 m)   Wt 143 lb 9.6 oz (65.1 kg)   BMI 26.26 kg/m  No LMP recorded. Patient is postmenopausal.  General:   Alert,  Well-developed, well-nourished, pleasant and cooperative in NAD Head:   Normocephalic and atraumatic. Eyes:  Sclera clear, no icterus.   Conjunctiva pink. Ears:  Normal auditory acuity. Nose:  No deformity, discharge, or lesions. Mouth:  No deformity or lesions,oropharynx pink & moist. Neck:  Supple; no masses or thyromegaly. Lungs:  Respirations even and unlabored.  Clear throughout to auscultation.   No wheezes, crackles, or rhonchi. No acute distress. Heart:  Regular rate and rhythm; no murmurs, clicks, rubs, or gallops. Abdomen:  Normal bowel sounds.  No bruits.  Soft, non-tender and non-distended without masses, hepatosplenomegaly or hernias noted.  No guarding or rebound tenderness.   Rectal: Nor performed Msk:  Symmetrical without gross deformities. Good, equal movement & strength bilaterally. Pulses:  Normal pulses noted. Extremities:  No clubbing or edema.  No cyanosis. Neurologic:  Alert and oriented x3;  grossly normal neurologically. Skin:  Intact without significant lesions or rashes. No jaundice. Lymph Nodes:  No significant cervical adenopathy. Psych:  Alert and cooperative. Normal mood and affect.  Imaging Studies: No abdominal imaging  Assessment and Plan:   Christine Mullins is a 71 y.o. y/o female with No significant past medical history with history of pandiverticulosis, sigmoid diverticulitis, chronic intermittent left lower quadrant pain. Her history is highly consistent with recurrent attacks of uncomplicated diverticulitis. Treatment options include course of antibiotics at the time of attacks to prevent future attacks or sigmoid colectomy if it's localized diverticulitis. Her last colonoscopy was 5 years ago and the polyp was detected. Pathology report not available. Therefore, I recommend repeat colonoscopy. I gave her a prescription for Cipro and metronidazole if she develops another attack of left lower quadrant pain.  Follow up in 3 months   Christine Darby, MD

## 2017-04-13 ENCOUNTER — Telehealth: Payer: Self-pay

## 2017-04-13 NOTE — Telephone Encounter (Signed)
Patient has been contacted with an earlier Colonoscopy date.  She has been moved to 05/15/17. Trish in Endo has been informed.  Thanks [employer]

## 2017-04-18 ENCOUNTER — Encounter: Payer: Self-pay | Admitting: Obstetrics and Gynecology

## 2017-04-18 ENCOUNTER — Telehealth: Payer: Self-pay | Admitting: Obstetrics and Gynecology

## 2017-04-18 ENCOUNTER — Ambulatory Visit (INDEPENDENT_AMBULATORY_CARE_PROVIDER_SITE_OTHER): Payer: Medicare Other | Admitting: Obstetrics and Gynecology

## 2017-04-18 ENCOUNTER — Other Ambulatory Visit: Payer: Self-pay | Admitting: Internal Medicine

## 2017-04-18 VITALS — BP 110/68 | HR 94 | Ht 62.0 in | Wt 144.0 lb

## 2017-04-18 DIAGNOSIS — R9389 Abnormal findings on diagnostic imaging of other specified body structures: Secondary | ICD-10-CM | POA: Diagnosis not present

## 2017-04-18 DIAGNOSIS — N9489 Other specified conditions associated with female genital organs and menstrual cycle: Secondary | ICD-10-CM | POA: Diagnosis not present

## 2017-04-18 DIAGNOSIS — Z1231 Encounter for screening mammogram for malignant neoplasm of breast: Secondary | ICD-10-CM

## 2017-04-18 DIAGNOSIS — R1030 Lower abdominal pain, unspecified: Secondary | ICD-10-CM | POA: Diagnosis not present

## 2017-04-18 DIAGNOSIS — N882 Stricture and stenosis of cervix uteri: Secondary | ICD-10-CM | POA: Diagnosis not present

## 2017-04-18 DIAGNOSIS — K5730 Diverticulosis of large intestine without perforation or abscess without bleeding: Secondary | ICD-10-CM | POA: Diagnosis not present

## 2017-04-18 MED ORDER — ZOLPIDEM TARTRATE 10 MG PO TABS: 10.0000 mg | ORAL_TABLET | Freq: Every evening | ORAL | 1 refills | Status: DC | PRN

## 2017-04-18 NOTE — Telephone Encounter (Signed)
Patient called back after appointment and wants to know if medication was called/sent in for her. I didn't see anything under the meds tabs.  Please call

## 2017-04-18 NOTE — Telephone Encounter (Signed)
Pt aware ambien rx left up front for p/u.

## 2017-04-18 NOTE — Progress Notes (Signed)
GYN ENCOUNTER NOTE  Subjective:       Christine Mullins is a 71 y.o. G56P1101 female is here for gynecologic evaluation of the following issues:  1. Endometrial mass on ultrasound.  Patient with long history of diverticulosis/diverticulitis and chronic left lower quadrant pain, presents for follow-up on pelvic ultrasound obtained for further investigation of left lower quadrant pelvic pain.   Over the past 3-4 years patient has had a persistent intermittent left lower quadrant pain described as a squeezing sensation which can last from hours to days. She has a at least 4 episodes per year. In 2011 she was hospitalized in [place] for acute diverticulitis. Christine Mullins has a colonoscopy scheduled for next week. Bowel function is reportedly normal. Bladder function is normal. She denies vaginal discharge or postmenopausal bleeding. She has no history of abnormal Pap smears.  03/08/2017 ultrasound:  FINDINGS: Uterus  Measurements: 4.4 x 1.7 x 2.2 cm. No fibroids or other mass visualized.  Endometrium  Thickness: 2.5 mm. There is fluid in the endometrial canal. A rounded echogenic region could represent tumefactive debris or a mass/polyp. This rounded region measures 4.4 x 2.6 x 3.5 mm  Right ovary  Not visualized.  Left ovary  Not visualized.  Other findings  No abnormal free fluid.  IMPRESSION: 1. There is fluid in the endometrial canal. A rounded 4.4 x 2.6 x 3.5 cm hypoechoic region could represent a polyp/mass versus tumefactive debris. Recommend gynecologic consultation with hysterosonography for further evaluation. 2. The ovaries were not visualized. 3. No other abnormalities. These results will be called to the ordering clinician or representative by the Radiologist Assistant, and communication documented in the PACS or zVision Dashboard.   Electronically Signed   By: Dorise Bullion III M.D   On: 03/08/2017 15:16   Gynecologic History Menarche-age  38 Menopause-age 52 (2005) No history of HRT use. Recent onset vasomotor symptoms including hot flashes without night sweats. Patient has not requested any therapy for these symptoms. Obstetric history is notable for an SAB 1 not requiring D&C, and a primary low cervical transverse cesarean section for footling breech area  Obstetric History OB History  Gravida Para Term Preterm AB Living  2 2 1 1  0 1  SAB TAB Ectopic Multiple Live Births  0       1    # Outcome Date GA Lbr Len/2nd Weight Sex Delivery Anes PTL Lv  2 Term 1976   6 lb 1.6 oz (2.767 kg) M CS-LTranv   LIV  1 Preterm 1975        FD      Past Medical History:  Diagnosis Date  . Arthritis   . Depression   . GERD (gastroesophageal reflux disease)   . Hx of diverticulitis of colon   . Hyperlipidemia   . Hypertension     Past Surgical History:  Procedure Laterality Date  . BREAST BIOPSY Left 1995   negative  . CESAREAN SECTION    . FOOT SURGERY Bilateral     Current Outpatient Prescriptions on File Prior to Visit  Medication Sig Dispense Refill  . aspirin EC 81 MG tablet Take 81 mg by mouth daily.    . benazepril (LOTENSIN) 10 MG tablet TAKE 1 TALBET BY MOUTH DAILY 90 tablet 1  . ciprofloxacin (CIPRO) 500 MG tablet Take 1 tablet (500 mg total) by mouth 2 (two) times daily. 20 tablet 0  . escitalopram (LEXAPRO) 10 MG tablet TAKE 1 TABLET BY MOUTH DAILY 90 tablet 1  .  hydrochlorothiazide (HYDRODIURIL) 25 MG tablet TAKE 1 TABLET BY MOUTH DAILY 90 tablet 1  . hydrOXYzine (ATARAX/VISTARIL) 25 MG tablet     . omeprazole (PRILOSEC) 20 MG capsule Take 1 capsule (20 mg total) by mouth daily. 30 capsule 3  . Probiotic Product (PROBIOTIC-10) CHEW Chew by mouth.    . simvastatin (ZOCOR) 40 MG tablet TAKE 1 TABLET BY MOUTH EVERY EVENING 90 tablet 0  . zolpidem (AMBIEN) 10 MG tablet Take 1 tablet (10 mg total) by mouth at bedtime as needed. for sleep 30 tablet 0   No current facility-administered medications on file prior  to visit.     No Known Allergies  Social History   Social History  . Marital status: Single    Spouse name: N/A  . Number of children: N/A  . Years of education: N/A   Occupational History  . Not on file.   Social History Main Topics  . Smoking status: Never Smoker  . Smokeless tobacco: Never Used  . Alcohol use Yes     Comment: social  . Drug use: No  . Sexual activity: Not Currently    Birth control/ protection: Post-menopausal   Other Topics Concern  . Not on file   Social History Narrative  . No narrative on file    Family History  Problem Relation Age of Onset  . Arthritis Mother   . Hyperlipidemia Mother   . Hyperlipidemia Father   . Heart failure Father   . Breast cancer Neg Hx   . Ovarian cancer Neg Hx   . Colon cancer Neg Hx     The following portions of the patient's history were reviewed and updated as appropriate: allergies, current medications, past family history, past medical history, past social history, past surgical history and problem list.  Review of Systems Review of Systems -Comprehensive review of systems is negative except for that noted in the history of present illness Objective:   BP 110/68   Pulse 94   Ht 5\' 2"  (1.575 m)   Wt 144 lb (65.3 kg)   BMI 26.34 kg/m  CONSTITUTIONAL: Well-developed, well-nourished female in no acute distress.  HENT:  Normocephalic, atraumatic.  NECK: Not examined SKIN: Skin is warm and dry. No rash noted. Not diaphoretic. No erythema. No pallor. [place]: Alert and oriented to person, place, and time. PSYCHIATRIC: Normal mood and affect. Normal behavior. Normal judgment and thought content. CARDIOVASCULAR:Not Examined RESPIRATORY: Not Examined BREASTS: Not Examined ABDOMEN: Soft, non distended; Non tender.  No Organomegaly. PELVIC:  External Genitalia: Normal  BUS: Normal  Vagina: Mild to moderate atrophy  Cervix: Stenotic; no lesions; no cervical motion tenderness  Uterus: Small, midplane,  Normal shape,consistency, mobile  [place]: Normal; nonpalpable nontender  RV: Normal external exam  Bladder: Nontender MUSCULOSKELETAL: Normal range of motion. No tenderness.  No cyanosis, clubbing, or edema.  PROCEDURE: Endocervical canal dilation with endometrial biopsy Indications: Abnormal pelvic ultrasound with endometrial cavity fluid collection and possible mass Findings: Procedure discontinued due to cervical stenosis and patient intolerance Biopsies: None Description: Verbal consent is obtained. Patient was placed in the dorsal lithotomy position. Bimanual exam demonstrates a neutral position. Peterson speculum was placed in the vagina to facilitate visualization of the cervix. Cervix appears stenotic. Single-tooth tenaculum was placed on the anterior lip of the cervix. Lacrimal duct probes were used to dilate the endocervical canal. The procedure is aborted in the mid range size dilators due to patient discomfort. Endometrial biopsy is therefore aborted. Blood loss is minimal. Complications are  none.     Assessment:   1. Abnormal ultrasound-Endometrial fluid collection with possible mass  2. Endometrial mass  3. Cervical stenosis (uterine cervix)  4. Lower abdominal pain, left lower quadrant  5. Diverticulosis of colon without hemorrhage     Plan:   1. Endocervical canal dilation with endometrial biopsy is discontinued (due to patient intolerance and cervical stenosis 2. Hysteroscopy/D&C in same day surgery was scheduled 3.  Patient is to return for preoperative appointment the week before surgery  Brayton Mars, MD  Note: This dictation was prepared with Dragon dictation along with smaller phrase technology. Any transcriptional errors that result from this process are unintentional.

## 2017-04-18 NOTE — Patient Instructions (Signed)
1. Endometrial biopsy was attempted today unsuccessful due to cervical stenosis 2. Hysteroscopy/D&C will be scheduled to assess the intrauterine mass and fluid noted on ultrasound 3. Return in 1 week prior surgery for preoperative appointment   Dilation and Curettage or Vacuum Curettage Dilation and curettage (D&C) and vacuum curettage are minor procedures. A D&C involves stretching (dilation) the cervix and scraping (curettage) the inside lining of the uterus (endometrium). During a D&C, tissue is gently scraped from the endometrium, starting from the top portion of the uterus down to the lowest part of the uterus (cervix). During a vacuum curettage, the lining and tissue in the uterus are removed with the use of gentle suction. Curettage may be performed to either diagnose or treat a problem. As a diagnostic procedure, curettage is performed to examine tissues from the uterus. A diagnostic curettage may be done if you have:  Irregular bleeding in the uterus.  Bleeding with the development of clots.  Spotting between menstrual periods.  Prolonged menstrual periods or other abnormal bleeding.  Bleeding after menopause.  No menstrual period (amenorrhea).  A change in size and shape of the uterus.  Abnormal endometrial cells discovered during a Pap test.  As a treatment procedure, curettage may be performed for the following reasons:  Removal of an IUD (intrauterine device).  Removal of retained placenta after giving birth.  Abortion.  Miscarriage.  Removal of endometrial polyps.  Removal of uncommon types of noncancerous lumps (fibroids).  Tell a health care provider about:  Any allergies you have, including allergies to prescribed medicine or latex.  All medicines you are taking, including vitamins, herbs, eye drops, creams, and over-the-counter medicines. This is especially important if you take any blood-thinning medicine. Bring a list of all of your medicines to your  appointment.  Any problems you or family members have had with anesthetic medicines.  Any blood disorders you have.  Any surgeries you have had.  Your medical history and any medical conditions you have.  Whether you are pregnant or may be pregnant.  Recent vaginal infections you have had.  Recent menstrual periods, bleeding problems you have had, and what form of birth control (contraception) you use. What are the risks? Generally, this is a safe procedure. However, problems may occur, including:  Infection.  Heavy vaginal bleeding.  Allergic reactions to medicines.  Damage to the cervix or other structures or organs.  Development of scar tissue (adhesions) inside the uterus, which can cause abnormal amounts of menstrual bleeding. This may make it harder to get pregnant in the future.  A hole (perforation) or puncture in the uterine wall. This is rare.  What happens before the procedure? Staying hydrated Follow instructions from your health care provider about hydration, which may include:  Up to 2 hours before the procedure - you may continue to drink clear liquids, such as water, clear fruit juice, black coffee, and plain tea.  Eating and drinking restrictions Follow instructions from your health care provider about eating and drinking, which may include:  8 hours before the procedure - stop eating heavy meals or foods such as meat, fried foods, or fatty foods.  6 hours before the procedure - stop eating light meals or foods, such as toast or cereal.  6 hours before the procedure - stop drinking milk or drinks that contain milk.  2 hours before the procedure - stop drinking clear liquids. If your health care provider told you to take your medicine(s) on the day of your procedure, take  them with only a sip of water.  Medicines  Ask your health care provider about: ? Changing or stopping your regular medicines. This is especially important if you are taking diabetes  medicines or blood thinners. ? Taking medicines such as aspirin and ibuprofen. These medicines can thin your blood. Do not take these medicines before your procedure if your health care provider instructs you not to.  You may be given antibiotic medicine to help prevent infection. General instructions  For 24 hours before your procedure, do not: ? Douche. ? Use tampons. ? Use medicines, creams, or suppositories in the vagina. ? Have sexual intercourse.  You may be given a pregnancy test on the day of the procedure.  Plan to have someone take you home from the hospital or clinic.  You may have a blood or urine sample taken.  If you will be going home right after the procedure, plan to have someone with you for 24 hours. What happens during the procedure?  To reduce your risk of infection: ? Your health care team will wash or sanitize their hands. ? Your skin will be washed with soap.  An IV tube will be inserted into one of your veins.  You will be given one of the following: ? A medicine that numbs the area in and around the cervix (local anesthetic). ? A medicine to make you fall asleep (general anesthetic).  You will lie down on your back, with your feet in foot rests (stirrups).  The size and position of your uterus will be checked.  A lubricated instrument (speculum or Sims retractor) will be inserted into the back side of your vagina. The speculum will be used to hold apart the walls of your vagina so your health care provider can see your cervix.  A tool (tenaculum) will be attached to the lip of the cervix to stabilize it.  Your cervix will be softened and dilated. This may be done by: ? Taking a medicine. ? Having tapered dilators or thin rods (laminaria) or gradual widening instruments (tapered dilators) inserted into your cervix.  A small, sharp, curved instrument (curette) will be used to scrape a small amount of tissue or cells from the endometrium or cervical  canal. In some cases, gentle suction is applied with the curette. The curette will then be removed. The cells will be taken to a lab for testing. The procedure may vary among health care providers and hospitals. What happens after the procedure?  You may have mild cramping, backache, pain, and light bleeding or spotting. You may pass small blood clots from your vagina.  You may have to wear compression stockings. These stockings help to prevent blood clots and reduce swelling in your legs.  Your blood pressure, heart rate, breathing rate, and blood oxygen level will be monitored until the medicines you were given have worn off. Summary  Dilation and curettage (D&C) involves stretching (dilation) the cervix and scraping (curettage) the inside lining of the uterus (endometrium).  After the procedure, you may have mild cramping, backache, pain, and light bleeding or spotting. You may pass small blood clots from your vagina.  Plan to have someone take you home from the hospital or clinic. This information is not intended to replace advice given to you by your health care provider. Make sure you discuss any questions you have with your health care provider. Document Released: 06/26/2005 Document Revised: 03/12/2016 Document Reviewed: 03/12/2016 Elsevier Interactive Patient Education  2018 [employer]. Hysteroscopy Hysteroscopy is  a procedure used for looking inside the womb (uterus). It may be done for various reasons, including:  To evaluate abnormal bleeding, fibroid (benign, noncancerous) tumors, polyps, scar tissue (adhesions), and possibly cancer of the uterus.  To look for lumps (tumors) and other uterine growths.  To look for causes of why a woman cannot get pregnant (infertility), causes of recurrent loss of pregnancy (miscarriages), or a lost intrauterine device (IUD).  To perform a sterilization by blocking the fallopian tubes from inside the uterus.  In this procedure, a thin,  flexible tube with a tiny light and camera on the end of it (hysteroscope) is used to look inside the uterus. A hysteroscopy should be done right after a menstrual period to be sure you are not pregnant. LET [facility] CARE PROVIDER KNOW ABOUT:  Any allergies you have.  All medicines you are taking, including vitamins, herbs, eye drops, creams, and over-the-counter medicines.  Previous problems you or members of your family have had with the use of anesthetics.  Any blood disorders you have.  Previous surgeries you have had.  Medical conditions you have. RISKS AND COMPLICATIONS Generally, this is a safe procedure. However, as with any procedure, complications can occur. Possible complications include:  Putting a hole in the uterus.  Excessive bleeding.  Infection.  Damage to the cervix.  Injury to other organs.  Allergic reaction to medicines.  Too much fluid used in the uterus for the procedure.  BEFORE THE PROCEDURE  Ask your health care provider about changing or stopping any regular medicines.  Do not take aspirin or blood thinners for 1 week before the procedure, or as directed by your health care provider. These can cause bleeding.  If you smoke, do not smoke for 2 weeks before the procedure.  In some cases, a medicine is placed in the cervix the day before the procedure. This medicine makes the cervix have a larger opening (dilate). This makes it easier for the instrument to be inserted into the uterus during the procedure.  Do not eat or drink anything for at least 8 hours before the surgery.  Arrange for someone to take you home after the procedure. PROCEDURE  You may be given a medicine to relax you (sedative). You may also be given one of the following: ? A medicine that numbs the area around the cervix (local anesthetic). ? A medicine that makes you sleep through the procedure (general anesthetic).  The hysteroscope is inserted through the vagina into  the uterus. The camera on the hysteroscope sends a picture to a TV screen. This gives the surgeon a good view inside the uterus.  During the procedure, air or a liquid is put into the uterus, which allows the surgeon to see better.  Sometimes, tissue is gently scraped from inside the uterus. These tissue samples are sent to a lab for testing. What to expect after the procedure  If you had a general anesthetic, you may be groggy for a couple hours after the procedure.  If you had a local anesthetic, you will be able to go home as soon as you are stable and feel ready.  You may have some cramping. This normally lasts for a couple days.  You may have bleeding, which varies from light spotting for a few days to menstrual-like bleeding for 3-7 days. This is normal.  If your test results are not back during the visit, make an appointment with your health care provider to find out the results. This  information is not intended to replace advice given to you by your health care provider. Make sure you discuss any questions you have with your health care provider. Document Released: 10/02/2000 Document Revised: 12/02/2015 Document Reviewed: 01/23/2013 Elsevier Interactive Patient Education  2017 [employer].

## 2017-04-24 ENCOUNTER — Encounter: Payer: Self-pay | Admitting: Obstetrics and Gynecology

## 2017-04-24 ENCOUNTER — Ambulatory Visit (INDEPENDENT_AMBULATORY_CARE_PROVIDER_SITE_OTHER): Payer: Medicare Other | Admitting: Obstetrics and Gynecology

## 2017-04-24 VITALS — BP 134/80 | HR 85 | Ht 62.0 in | Wt 142.3 lb

## 2017-04-24 DIAGNOSIS — N882 Stricture and stenosis of cervix uteri: Secondary | ICD-10-CM

## 2017-04-24 DIAGNOSIS — N9489 Other specified conditions associated with female genital organs and menstrual cycle: Secondary | ICD-10-CM

## 2017-04-24 DIAGNOSIS — R1030 Lower abdominal pain, unspecified: Secondary | ICD-10-CM

## 2017-04-24 DIAGNOSIS — Z01818 Encounter for other preprocedural examination: Secondary | ICD-10-CM

## 2017-04-24 DIAGNOSIS — R9389 Abnormal findings on diagnostic imaging of other specified body structures: Secondary | ICD-10-CM

## 2017-04-24 NOTE — H&P (Signed)
Subjective: PREOPERATIVE HISTORY AND PHYSICAL   date of surgery: 04/30/2017 Procedure: Hysteroscopy/D&C Diagnoses: 1. Lower abdominal pain, left lower quadrant Abnormal pelvic ultrasound with endometrial mass 3. Cervical Stenosis   Patient is a 71 y.o. [id number] scheduled for  Surgery on 04/30/2017. Patient has long history of left lower quadrant abdominal pelvic pain. Preoperative pelvic ultrasound demonstrated a possible endometrial mass and fluid collection within the uterine cavity. Endometrial biopsy could not be accomplished due to cervical stenosis and patient intolerance. Patient presents for hysteroscopy/D&C  Patient with long history of diverticulosis/diverticulitis and chronic left lower quadrant pain,  Over the past 3-4 years patient has had a persistent intermittent left lower quadrant pain described as a squeezing sensation which can last from hours to days. She has a at least 4 episodes per year. In 2011 she was hospitalized in [place] for acute diverticulitis. Rosabell has a colonoscopy scheduled for next week. Bowel function is reportedly normal. Bladder function is normal. She denies vaginal discharge or postmenopausal bleeding. She has no history of abnormal Pap smears.  03/08/2017 ultrasound:  FINDINGS: Uterus  Measurements: 4.4 x 1.7 x 2.2 cm. No fibroids or other mass visualized.  Endometrium  Thickness: 2.5 mm. There is fluid in the endometrial canal. A rounded echogenic region could represent tumefactive debris or a mass/polyp. This rounded region measures 4.4 x 2.6 x 3.5 mm  Right ovary  Not visualized.  Left ovary  Not visualized.  Other findings  No abnormal free fluid.  IMPRESSION: 1. There is fluid in the endometrial canal. A rounded 4.4 x 2.6 x 3.5 cm hypoechoic region could represent a polyp/mass versus tumefactive debris. Recommend gynecologic consultation with hysterosonography for further evaluation. 2. The ovaries were not  visualized. 3. No other abnormalities  OB History    Gravida Para Term Preterm AB Living   2 2 1 1  0 1   SAB TAB Ectopic Multiple Live Births   0       1     No LMP recorded. Patient is postmenopausal.    Past Medical History:  Diagnosis Date  . Arthritis   . Depression   . GERD (gastroesophageal reflux disease)   . Hx of diverticulitis of colon   . Hyperlipidemia   . Hypertension     Past Surgical History:  Procedure Laterality Date  . BREAST BIOPSY Left 1995   negative  . CESAREAN SECTION    . FOOT SURGERY Bilateral     OB History  Gravida Para Term Preterm AB Living  2 2 1 1  0 1  SAB TAB Ectopic Multiple Live Births  0       1    # Outcome Date GA Lbr Len/2nd Weight Sex Delivery Anes PTL Lv  2 Term 1976   6 lb 1.6 oz (2.767 kg) M CS-[id number]   LIV  1 Preterm 59        FD      Social History   Social History  . Marital status: Single    Spouse name: N/A  . Number of children: N/A  . Years of education: N/A   Social History Main Topics  . Smoking status: Never Smoker  . Smokeless tobacco: Never Used  . Alcohol use Yes     Comment: social  . Drug use: No  . Sexual activity: Not Currently    Birth control/ protection: Post-menopausal   Other Topics Concern  . None   Social History Narrative  . None    Family History  Problem Relation Age of Onset  . Arthritis Mother   . Hyperlipidemia Mother   . Hyperlipidemia Father   . Heart failure Father   . Breast cancer Neg Hx   . Ovarian cancer Neg Hx   . Colon cancer Neg Hx      (Not in a hospital admission)  No Known Allergies  Review of Systems Constitutional: No recent fever/chills/sweats Respiratory: No recent cough/bronchitis Cardiovascular: No chest pain Gastrointestinal: No recent nausea/vomiting/diarrhea Genitourinary: No UTI symptoms Hematologic/lymphatic:No history of coagulopathy or recent blood thinner use    Objective:    BP 134/80   Pulse 85   Ht 5\' 2"  (1.575 m)   Wt  142 lb 4.8 oz (64.5 kg)   BMI 26.03 kg/m   General:   Normal  Skin:   normal  HEENT:  Normal  Neck:  Supple without Adenopathy or Thyromegaly  Lungs:   Heart:              Breasts:   Abdomen:  Pelvis:  M/S   Extremeties:  Neuro:    clear to auscultation bilaterally   Normal without murmur   Not Examined   soft, non-tender; bowel sounds normal; no masses,  no organomegaly   Exam deferred to OR  No CVAT  Warm/Dry   Normal         04/18/2017 PELVIC:             External Genitalia: Normal             BUS: Normal             Vagina: Mild to moderate atrophy             Cervix: Stenotic; no lesions; no cervical motion tenderness             Uterus: Small, midplane, Normal shape,consistency, mobile             Adnexa: Normal; nonpalpable nontender             RV: Normal external exam             Bladder: Nontender Assessment:      1. Abnormal ultrasound-Endometrial fluid collection with possible mass  2. Endometrial mass  3. Cervical stenosis (uterine cervix)  4. Lower abdominal pain, left lower quadrant  5. Diverticulosis of colon without hemorrhage      Plan:  Hysteroscopy/D&C    Prep  Consent: The patient is to undergo hysteroscopy/D&C for evaluation of  abnormalpelvic ultrasound notable for abnormal fluid collection and possible endometrial mass. She has history of chronic left lower quadrant pain. The patient is aware of and is accepting of all surgical risks which include but are not limited to bleeding, infection, pelvic organ injury with need for repair, blood clot disorders, anesthesia risks, etc. All questions have been answered. Informed consent is given. Patient is ready and willing to proceed with surgery as scheduled.  Brayton Mars, MD  Note: This dictation was prepared with Dragon dictation along with smaller phrase technology. Any transcriptional errors that result from this process are unintentional.

## 2017-04-24 NOTE — Patient Instructions (Addendum)
1. Return for postop check 1 week after surgery   Dilation and Curettage or Vacuum Curettage, Care After This sheet gives you information about how to care for yourself after your procedure. Your health care provider may also give you more specific instructions. If you have problems or questions, contact your health care provider. What can I expect after the procedure? After your procedure, it is common to have:  Mild pain or cramping.  Some vaginal bleeding or spotting.  These may last for up to 2 weeks after your procedure. Follow these instructions at home: Activity   Do not drive or use heavy machinery while taking prescription pain medicine.  Avoid driving for the first 24 hours after your procedure.  Take frequent, short walks, followed by rest periods, throughout the day. Ask your health care provider what activities are safe for you. After 1-2 days, you may be able to return to your normal activities.  Do not lift anything heavier than 10 lb (4.5 kg) until your health care provider approves.  For at least 2 weeks, or as long as told by your health care provider, do not: ? Douche. ? Use tampons. ? Have sexual intercourse. General instructions   Take over-the-counter and prescription medicines only as told by your health care provider. This is especially important if you take blood thinning medicine.  Do not take baths, swim, or use a hot tub until your health care provider approves. Take showers instead of baths.  Wear compression stockings as told by your health care provider. These stockings help to prevent blood clots and reduce swelling in your legs.  It is your responsibility to get the results of your procedure. Ask your health care provider, or the department performing the procedure, when your results will be ready.  Keep all follow-up visits as told by your health care provider. This is important. Contact a health care provider if:  You have severe cramps  that get worse or that do not get better with medicine.  You have severe abdominal pain.  You cannot drink fluids without vomiting.  You develop pain in a different area of your pelvis.  You have bad-smelling vaginal discharge.  You have a rash. Get help right away if:  You have vaginal bleeding that soaks more than one sanitary pad in 1 hour, for 2 hours in a row.  You pass large blood clots from your vagina.  You have a fever that is above 100.74F (38.0C).  Your abdomen feels very tender or hard.  You have chest pain.  You have shortness of breath.  You cough up blood.  You feel dizzy or light-headed.  You faint.  You have pain in your neck or shoulder area. This information is not intended to replace advice given to you by your health care provider. Make sure you discuss any questions you have with your health care provider. Document Released: 06/23/2000 Document Revised: 02/23/2016 Document Reviewed: 01/27/2016 Elsevier Interactive Patient Education  2018 [employer]. Hysteroscopy, Care After Refer to this sheet in the next few weeks. These instructions provide you with information on caring for yourself after your procedure. Your health care provider may also give you more specific instructions. Your treatment has been planned according to current medical practices, but problems sometimes occur. Call your health care provider if you have any problems or questions after your procedure. What can I expect after the procedure? After your procedure, it is typical to have the following:  You may have some  cramping. This normally lasts for a couple days.  You may have bleeding. This can vary from light spotting for a few days to menstrual-like bleeding for 3-7 days.  Follow these instructions at home:  Rest for the first 1-2 days after the procedure.  Only take over-the-counter or prescription medicines as directed by your health care provider. Do not take aspirin. It  can increase the chances of bleeding.  Take showers instead of baths for 2 weeks or as directed by your health care provider.  Do not drive for 24 hours or as directed.  Do not drink alcohol while taking pain medicine.  Do not use tampons, douche, or have sexual intercourse for 2 weeks or until your health care provider says it is okay.  Take your temperature twice a day for 4-5 days. Write it down each time.  Follow your health care provider's advice about diet, exercise, and lifting.  If you develop constipation, you may: ? Take a mild laxative if your health care provider approves. ? Add bran foods to your diet. ? Drink enough fluids to keep your urine clear or pale yellow.  Try to have someone with you or available to you for the first 24-48 hours, especially if you were given a general anesthetic.  Follow up with your health care provider as directed. Contact a health care provider if:  You feel dizzy or lightheaded.  You feel sick to your stomach (nauseous).  You have abnormal vaginal discharge.  You have a rash.  You have pain that is not controlled with medicine. Get help right away if:  You have bleeding that is heavier than a normal menstrual period.  You have a fever.  You have increasing cramps or pain, not controlled with medicine.  You have new belly (abdominal) pain.  You pass out.  You have pain in the tops of your shoulders (shoulder strap areas).  You have shortness of breath. This information is not intended to replace advice given to you by your health care provider. Make sure you discuss any questions you have with your health care provider. Document Released: 04/16/2013 Document Revised: 12/02/2015 Document Reviewed: 01/23/2013 Elsevier Interactive Patient Education  2017 [employer].

## 2017-04-24 NOTE — Progress Notes (Signed)
Subjective: PREOPERATIVE HISTORY AND PHYSICAL   date of surgery: 04/30/2017 Procedure: Hysteroscopy/D&C Diagnoses: 1. Lower abdominal pain, left lower quadrant Abnormal pelvic ultrasound with endometrial mass 3. Cervical Stenosis   Patient is a 71 y.o. G2P1153female scheduled for  Surgery on 04/30/2017. Patient has long history of left lower quadrant abdominal pelvic pain. Preoperative pelvic ultrasound demonstrated a possible endometrial mass and fluid collection within the uterine cavity. Endometrial biopsy could not be accomplished due to cervical stenosis and patient intolerance. Patient presents for hysteroscopy/D&C  Patient with long history of diverticulosis/diverticulitis and chronic left lower quadrant pain,  Over the past 3-4 years patient has had a persistent intermittent left lower quadrant pain described as a squeezing sensation which can last from hours to days. She has a at least 4 episodes per year. In 2011 she was hospitalized in [place] for acute diverticulitis. Christine Mullins has a colonoscopy scheduled for next week. Bowel function is reportedly normal. Bladder function is normal. She denies vaginal discharge or postmenopausal bleeding. She has no history of abnormal Pap smears.  03/08/2017 ultrasound:  FINDINGS: Uterus  Measurements: 4.4 x 1.7 x 2.2 cm. No fibroids or other mass visualized.  Endometrium  Thickness: 2.5 mm. There is fluid in the endometrial canal. A rounded echogenic region could represent tumefactive debris or a mass/polyp. This rounded region measures 4.4 x 2.6 x 3.5 mm  Right ovary  Not visualized.  Left ovary  Not visualized.  Other findings  No abnormal free fluid.  IMPRESSION: 1. There is fluid in the endometrial canal. A rounded 4.4 x 2.6 x 3.5 cm hypoechoic region could represent a polyp/mass versus tumefactive debris. Recommend gynecologic consultation with hysterosonography for further evaluation. 2. The ovaries were not  visualized. 3. No other abnormalities  OB History    Gravida Para Term Preterm AB Living   2 2 1 1  0 1   SAB TAB Ectopic Multiple Live Births   0       1     No LMP recorded. Patient is postmenopausal.    Past Medical History:  Diagnosis Date  . Arthritis   . Depression   . GERD (gastroesophageal reflux disease)   . Hx of diverticulitis of colon   . Hyperlipidemia   . Hypertension     Past Surgical History:  Procedure Laterality Date  . BREAST BIOPSY Left 1995   negative  . CESAREAN SECTION    . FOOT SURGERY Bilateral     OB History  Gravida Para Term Preterm AB Living  2 2 1 1  0 1  SAB TAB Ectopic Multiple Live Births  0       1    # Outcome Date GA Lbr Len/2nd Weight Sex Delivery Anes PTL Lv  2 Term 1976   6 lb 1.6 oz (2.767 kg) M CS-LTranv   LIV  1 Preterm 63        FD      Social History   Social History  . Marital status: Single    Spouse name: N/A  . Number of children: N/A  . Years of education: N/A   Social History Main Topics  . Smoking status: Never Smoker  . Smokeless tobacco: Never Used  . Alcohol use Yes     Comment: social  . Drug use: No  . Sexual activity: Not Currently    Birth control/ protection: Post-menopausal   Other Topics Concern  . None   Social History Narrative  . None    Family History  Problem Relation Age of Onset  . Arthritis Mother   . Hyperlipidemia Mother   . Hyperlipidemia Father   . Heart failure Father   . Breast cancer Neg Hx   . Ovarian cancer Neg Hx   . Colon cancer Neg Hx      (Not in a hospital admission)  No Known Allergies  Review of Systems Constitutional: No recent fever/chills/sweats Respiratory: No recent cough/bronchitis Cardiovascular: No chest pain Gastrointestinal: No recent nausea/vomiting/diarrhea Genitourinary: No UTI symptoms Hematologic/lymphatic:No history of coagulopathy or recent blood thinner use    Objective:    BP 134/80   Pulse 85   Ht 5\' 2"  (1.575 m)   Wt  142 lb 4.8 oz (64.5 kg)   BMI 26.03 kg/m   General:   Normal  Skin:   normal  HEENT:  Normal  Neck:  Supple without Adenopathy or Thyromegaly  Lungs:   Heart:              Breasts:   Abdomen:  Pelvis:  M/S   Extremeties:  Neuro:    clear to auscultation bilaterally   Normal without murmur   Not Examined   soft, non-tender; bowel sounds normal; no masses,  no organomegaly   Exam deferred to OR  No CVAT  Warm/Dry   Normal         04/18/2017 PELVIC:             External Genitalia: Normal             BUS: Normal             Vagina: Mild to moderate atrophy             Cervix: Stenotic; no lesions; no cervical motion tenderness             Uterus: Small, midplane, Normal shape,consistency, mobile             Adnexa: Normal; nonpalpable nontender             RV: Normal external exam             Bladder: Nontender Assessment:      1. Abnormal ultrasound-Endometrial fluid collection with possible mass  2. Endometrial mass  3. Cervical stenosis (uterine cervix)  4. Lower abdominal pain, left lower quadrant  5. Diverticulosis of colon without hemorrhage      Plan:  Hysteroscopy/D&C    Prep  Consent: The patient is to undergo hysteroscopy/D&C for evaluation of  abnormalpelvic ultrasound notable for abnormal fluid collection and possible endometrial mass. She has history of chronic left lower quadrant pain. The patient is aware of and is accepting of all surgical risks which include but are not limited to bleeding, infection, pelvic organ injury with need for repair, blood clot disorders, anesthesia risks, etc. All questions have been answered. Informed consent is given. Patient is ready and willing to proceed with surgery as scheduled.  Brayton Mars, MD  Note: This dictation was prepared with Dragon dictation along with smaller phrase technology. Any transcriptional errors that result from this process are unintentional.

## 2017-04-25 ENCOUNTER — Encounter
Admission: RE | Admit: 2017-04-25 | Discharge: 2017-04-25 | Disposition: A | Discharge status: Home / Self Care | Payer: Medicare Other | Source: Ambulatory Visit | Attending: Obstetrics and Gynecology | Admitting: Obstetrics and Gynecology

## 2017-04-25 ENCOUNTER — Inpatient Hospital Stay: Admission: RE | Admit: 2017-04-25 | Payer: Medicare Other | Source: Ambulatory Visit

## 2017-04-25 DIAGNOSIS — Z8261 Family history of arthritis: Secondary | ICD-10-CM | POA: Insufficient documentation

## 2017-04-25 DIAGNOSIS — Z9889 Other specified postprocedural states: Secondary | ICD-10-CM | POA: Diagnosis not present

## 2017-04-25 DIAGNOSIS — I10 Essential (primary) hypertension: Secondary | ICD-10-CM | POA: Diagnosis not present

## 2017-04-25 DIAGNOSIS — Z8249 Family history of ischemic heart disease and other diseases of the circulatory system: Secondary | ICD-10-CM | POA: Insufficient documentation

## 2017-04-25 DIAGNOSIS — M1990 Unspecified osteoarthritis, unspecified site: Secondary | ICD-10-CM | POA: Diagnosis not present

## 2017-04-25 DIAGNOSIS — F329 Major depressive disorder, single episode, unspecified: Secondary | ICD-10-CM | POA: Insufficient documentation

## 2017-04-25 DIAGNOSIS — E785 Hyperlipidemia, unspecified: Secondary | ICD-10-CM | POA: Insufficient documentation

## 2017-04-25 DIAGNOSIS — N882 Stricture and stenosis of cervix uteri: Secondary | ICD-10-CM | POA: Diagnosis not present

## 2017-04-25 DIAGNOSIS — R1900 Intra-abdominal and pelvic swelling, mass and lump, unspecified site: Secondary | ICD-10-CM | POA: Diagnosis not present

## 2017-04-25 DIAGNOSIS — Z01812 Encounter for preprocedural laboratory examination: Secondary | ICD-10-CM | POA: Diagnosis not present

## 2017-04-25 DIAGNOSIS — K219 Gastro-esophageal reflux disease without esophagitis: Secondary | ICD-10-CM | POA: Diagnosis not present

## 2017-04-25 DIAGNOSIS — K5790 Diverticulosis of intestine, part unspecified, without perforation or abscess without bleeding: Secondary | ICD-10-CM | POA: Diagnosis not present

## 2017-04-25 DIAGNOSIS — R1032 Left lower quadrant pain: Secondary | ICD-10-CM | POA: Insufficient documentation

## 2017-04-25 LAB — TYPE AND SCREEN
ABO/RH(D): O POS
ANTIBODY SCREEN: NEGATIVE

## 2017-04-25 LAB — CBC WITH DIFFERENTIAL/PLATELET
BASOS PCT: 1 %
Basophils Absolute: 0.1 10*3/uL (ref 0–0.1)
Eosinophils Absolute: 0.1 10*3/uL (ref 0–0.7)
Eosinophils Relative: 1 %
HEMATOCRIT: 39.9 % (ref 35.0–47.0)
HEMOGLOBIN: 13.8 g/dL (ref 12.0–16.0)
Lymphocytes Relative: 21 %
Lymphs Abs: 1.6 10*3/uL (ref 1.0–3.6)
MCH: 29.8 pg (ref 26.0–34.0)
MCHC: 34.7 g/dL (ref 32.0–36.0)
MCV: 86 fL (ref 80.0–100.0)
MONOS PCT: 10 %
Monocytes Absolute: 0.7 10*3/uL (ref 0.2–0.9)
NEUTROS ABS: 4.9 10*3/uL (ref 1.4–6.5)
NEUTROS PCT: 67 %
PLATELETS: 297 10*3/uL (ref 150–440)
RBC: 4.64 MIL/uL (ref 3.80–5.20)
RDW: 13.9 % (ref 11.5–14.5)
WBC: 7.4 10*3/uL (ref 3.6–11.0)

## 2017-04-25 LAB — RAPID HIV SCREEN (HIV 1/2 AB+AG)
HIV 1/2 ANTIBODIES: NONREACTIVE
HIV-1 P24 ANTIGEN - HIV24: NONREACTIVE

## 2017-04-25 NOTE — Patient Instructions (Signed)
Your procedure is scheduled on: Monday April 30, 2017 MONDAY Report to Same Day Surgery on the 2nd floor in the [facility]. To find out your arrival time, please call [phone] between 1PM - 3PM OF:HQRFXJ April 27, 2017  REMEMBER: Instructions that are not followed completely may result in serious medical risk up to and including death; or upon the discretion of your surgeon and anesthesiologist your surgery may need to be rescheduled.  Do not eat food or drink liquids after midnight. No gum chewing or hard candies.  You may however, drink CLEAR liquids up to 2 hours before you are scheduled to arrive at the hospital for your procedure.  Do not drink clear liquids within 2 hours of your scheduled arrival to the hospital as this may lead to your procedure being delayed or rescheduled.  Clear liquids include: - water  - apple juice without pulp - clear gatorade - black coffee or tea (NO milk, creamers, sugars) DO NOT drink anything not on this list.    No Alcohol for 24 hours before or after surgery.  No Smoking for 24 hours prior to surgery.  Notify your doctor if there is any change in your medical condition (cold, fever, infection).  Do not wear jewelry, make-up, hairpins, clips or nail polish.  Do not wear lotions, powders, or perfumes.   Do not shave 48 hours prior to surgery. Men may shave face and neck.  Contacts and dentures may not be worn into surgery.  Do not bring valuables to the hospital. [facility] is not responsible for any belongings or valuables.   TAKE THESE MEDICATIONS THE MORNING OF SURGERY WITH A SIP OF WATER: OMEPRAZOLE TAKE A DOSE THE NIGHT BEFORE AND MORNING OF SURGERY  LEXAPRO    Follow recommendations from Cardiologist, Pulmonologist or PCP regarding stopping Aspirin, Coumadin, Plavix, Eliquis, Pradaxa, or Pletal.  Stop Anti-inflammatories such as Advil, Aleve, Ibuprofen, Motrin, Naproxen, Naprosyn, Goodie powder, or aspirin products.  (May take Tylenol or Acetaminophen and Celebrex if needed.)  Stop supplements until after surgery PROBIOTIC (May continue Vitamin D, Vitamin B, and multivitamin.)  If you are being admitted to the hospital overnight, leave your suitcase in the car. After surgery it may be brought to your room.   If you are taking public transportation, you will need to have a responsible adult to with you.  Please call the number above if you have any questions about these instructions.

## 2017-04-26 ENCOUNTER — Ambulatory Visit (INDEPENDENT_AMBULATORY_CARE_PROVIDER_SITE_OTHER): Payer: Medicare Other | Admitting: Internal Medicine

## 2017-04-26 ENCOUNTER — Encounter: Payer: Self-pay | Admitting: Internal Medicine

## 2017-04-26 DIAGNOSIS — N9489 Other specified conditions associated with female genital organs and menstrual cycle: Secondary | ICD-10-CM

## 2017-04-26 DIAGNOSIS — E7800 Pure hypercholesterolemia, unspecified: Secondary | ICD-10-CM

## 2017-04-26 DIAGNOSIS — K219 Gastro-esophageal reflux disease without esophagitis: Secondary | ICD-10-CM

## 2017-04-26 DIAGNOSIS — R1030 Lower abdominal pain, unspecified: Secondary | ICD-10-CM

## 2017-04-26 DIAGNOSIS — F419 Anxiety disorder, unspecified: Secondary | ICD-10-CM | POA: Diagnosis not present

## 2017-04-26 DIAGNOSIS — I10 Essential (primary) hypertension: Secondary | ICD-10-CM

## 2017-04-26 DIAGNOSIS — K5730 Diverticulosis of large intestine without perforation or abscess without bleeding: Secondary | ICD-10-CM

## 2017-04-26 LAB — RPR: RPR: NONREACTIVE

## 2017-04-26 NOTE — Progress Notes (Signed)
Patient ID: Christine Mullins, female   DOB: July 15, 1945, 71 y.o.   MRN: [id number]   Subjective:    Patient ID: Christine Mullins, female    DOB: 09-26-1945, 71 y.o.   MRN: [id number]  HPI  Patient here for a scheduled follow up.  She has been seeing Dr Enzo Bi.  Marcille Buffy to have possible endometrial mass and fluid collection with the uterine cavity.  Endometrial biopsy could not be accomplished due to cervical stenosis.  Planning for D&C.  She also has seen Dr Marius Ditch.  Has a history of pandiverticulitis.  Scheduled for colonoscopy 05/15/17.  Has cipro and flagyl if has another flare.  She is taking probiotics.  This has helped.  No chest pain.  No sob.  No acid reflux.  Bowels moving.  No urine change.     Past Medical History:  Diagnosis Date  . Arthritis   . Depression   . GERD (gastroesophageal reflux disease)   . Hx of diverticulitis of colon   . Hyperlipidemia   . Hypertension    Past Surgical History:  Procedure Laterality Date  . BREAST BIOPSY Left 1995   negative  . CATARACT EXTRACTION, BILATERAL    . CESAREAN SECTION    . FOOT SURGERY Bilateral   . GANGLION CYST EXCISION Left 1965   wrist  . GANGLION CYST EXCISION Right 1980   hand  . KNEE ARTHROSCOPY Right 2000   Family History  Problem Relation Age of Onset  . Arthritis Mother   . Hyperlipidemia Mother   . Hyperlipidemia Father   . Heart failure Father   . Breast cancer Neg Hx   . Ovarian cancer Neg Hx   . Colon cancer Neg Hx    Social History   Social History  . Marital status: Single    Spouse name: N/A  . Number of children: N/A  . Years of education: N/A   Social History Main Topics  . Smoking status: Never Smoker  . Smokeless tobacco: Never Used  . Alcohol use Yes     Comment: social  . Drug use: No  . Sexual activity: Not Currently    Birth control/ protection: Post-menopausal   Other Topics Concern  . None   Social History Narrative  . None    Outpatient Encounter Prescriptions as of 04/26/2017    Medication Sig  . aspirin EC 81 MG tablet Take 81 mg by mouth daily.  . benazepril (LOTENSIN) 10 MG tablet TAKE 1 TALBET BY MOUTH DAILY (Patient taking differently: Take 10 mg by mouth daily)  . escitalopram (LEXAPRO) 10 MG tablet TAKE 1 TABLET BY MOUTH DAILY (Patient taking differently: Take 10 mg by mouth daily)  . hydrochlorothiazide (HYDRODIURIL) 25 MG tablet TAKE 1 TABLET BY MOUTH DAILY (Patient taking differently: Take 25 mg by mouth daily)  . ibuprofen (ADVIL,MOTRIN) 200 MG tablet Take 400 mg by mouth every 6 (six) hours as needed for headache or moderate pain.  . naproxen sodium (ANAPROX) 220 MG tablet Take 220 mg by mouth 2 (two) times daily as needed (for pain or headache).  Marland Kitchen omeprazole (PRILOSEC) 20 MG capsule Take 1 capsule (20 mg total) by mouth daily. (Patient taking differently: Take 40 mg by mouth daily. )  . Probiotic Product (PROBIOTIC PO) Take 1 tablet by mouth daily.  . simvastatin (ZOCOR) 40 MG tablet TAKE 1 TABLET BY MOUTH EVERY EVENING (Patient taking differently: Take 40 mg by mouth every evening)  . zolpidem (AMBIEN) 10 MG tablet Take 1 tablet (  10 mg total) by mouth at bedtime as needed. for sleep (Patient taking differently: Take 10 mg by mouth at bedtime as needed for sleep. )   No facility-administered encounter medications on file as of 04/26/2017.     Review of Systems  Constitutional: Negative for appetite change and unexpected weight change.  HENT: Negative for congestion and sinus pressure.   Respiratory: Negative for cough, chest tightness and shortness of breath.   Cardiovascular: Negative for chest pain, palpitations and leg swelling.  Gastrointestinal: Positive for abdominal pain. Negative for diarrhea, nausea and vomiting.  Genitourinary: Negative for difficulty urinating and dysuria.  Musculoskeletal: Negative for joint swelling and myalgias.  Skin: Negative for color change and rash.  Neurological: Negative for dizziness, light-headedness and  headaches.  Psychiatric/Behavioral: Negative for agitation and dysphoric mood.       Objective:     Blood pressure rechecked by me:  128/72  Physical Exam  Constitutional: She appears well-developed and well-nourished. No distress.  HENT:  Nose: Nose normal.  Mouth/Throat: Oropharynx is clear and moist.  Neck: Neck supple. No thyromegaly present.  Cardiovascular: Normal rate and regular rhythm.   Pulmonary/Chest: Breath sounds normal. No respiratory distress. She has no wheezes.  Abdominal: Soft. Bowel sounds are normal. There is no tenderness.  Musculoskeletal: She exhibits no edema or tenderness.  Lymphadenopathy:    She has no cervical adenopathy.  Skin: No rash noted. No erythema.  Psychiatric: She has a normal mood and affect. Her behavior is normal.    BP 120/70 (BP Location: Left Arm, Patient Position: Sitting, Cuff Size: Normal)   Pulse 86   Temp 97.8 F (36.6 C) (Oral)   Resp 20   Ht 5' 1.81" (1.57 m)   Wt 142 lb 3.2 oz (64.5 kg)   SpO2 98%   BMI 26.17 kg/m  Wt Readings from Last 3 Encounters:  04/26/17 142 lb 3.2 oz (64.5 kg)  04/25/17 142 lb (64.4 kg)  04/24/17 142 lb 4.8 oz (64.5 kg)     Lab Results  Component Value Date   WBC 7.4 04/25/2017   HGB 13.8 04/25/2017   HCT 39.9 04/25/2017   PLT 297 04/25/2017   GLUCOSE 100 (H) 03/01/2017   CHOL 103 03/01/2017   TRIG 58.0 03/01/2017   HDL 34.20 (L) 03/01/2017   LDLCALC 58 03/01/2017   ALT 22 03/01/2017   AST 23 03/01/2017   NA 140 03/01/2017   K 3.7 03/01/2017   CL 101 03/01/2017   CREATININE 0.73 03/01/2017   BUN 14 03/01/2017   CO2 34 (H) 03/01/2017   TSH 0.77 05/05/2016       Assessment & Plan:   Problem List Items Addressed This Visit    Anxiety    On lexapro and doing well.  Follow.        Diverticulosis of colon without hemorrhage    Has had recurring episodes.  Seeing GI.  Planning for colonoscopy.        Endometrial mass    Seeing gyn.  Planning for D&C.  Follow.         Essential hypertension    Blood pressure under good control.  Continue same medication regimen.  Follow pressures.  Follow metabolic panel.        Relevant Orders   TSH   Basic metabolic panel   GERD (gastroesophageal reflux disease)    Controlled on omeprazole.  Taking 40mg  q day.  Follow.       Hypercholesterolemia    On simvastatin.  Low cholesterol diet and exercise.  Follow lipid panel and liver function tests.        Relevant Orders   Lipid panel   Hepatic function panel   Lower abdominal pain    Being worked up currently.  Seeing gyn as outlined.  Planning for D&C.  Also saw GI.  Planning for colonoscopy in 05/2017.  Stable.  Follow.            Einar Pheasant, MD

## 2017-04-29 ENCOUNTER — Encounter: Payer: Self-pay | Admitting: Internal Medicine

## 2017-04-29 NOTE — Assessment & Plan Note (Signed)
Blood pressure under good control.  Continue same medication regimen.  Follow pressures.  Follow metabolic panel.   

## 2017-04-29 NOTE — Assessment & Plan Note (Signed)
Has had recurring episodes.  Seeing GI.  Planning for colonoscopy.

## 2017-04-29 NOTE — Assessment & Plan Note (Signed)
On simvastatin.  Low cholesterol diet and exercise.  Follow lipid panel and liver function tests.   

## 2017-04-29 NOTE — Assessment & Plan Note (Signed)
Seeing gyn.  Planning for D&C.  Follow.

## 2017-04-29 NOTE — Assessment & Plan Note (Signed)
Being worked up currently.  Seeing gyn as outlined.  Planning for D&C.  Also saw GI.  Planning for colonoscopy in 05/2017.  Stable.  Follow.

## 2017-04-29 NOTE — Assessment & Plan Note (Signed)
Controlled on omeprazole.  Taking 40mg  q day.  Follow.

## 2017-04-29 NOTE — Assessment & Plan Note (Signed)
On lexapro and doing well.  Follow.

## 2017-04-30 ENCOUNTER — Encounter: Payer: Self-pay | Admitting: *Deleted

## 2017-04-30 ENCOUNTER — Ambulatory Visit: Payer: Medicare Other | Admitting: Anesthesiology

## 2017-04-30 ENCOUNTER — Encounter
Admission: RE | Disposition: A | Discharge status: Home / Self Care | Payer: Self-pay | Source: Ambulatory Visit | Attending: Obstetrics and Gynecology

## 2017-04-30 ENCOUNTER — Ambulatory Visit
Admission: RE | Admit: 2017-04-30 | Discharge: 2017-04-30 | Disposition: A | Discharge status: Home / Self Care | Payer: Medicare Other | Source: Ambulatory Visit | Attending: Obstetrics and Gynecology | Admitting: Obstetrics and Gynecology

## 2017-04-30 DIAGNOSIS — N858 Other specified noninflammatory disorders of uterus: Secondary | ICD-10-CM | POA: Diagnosis not present

## 2017-04-30 DIAGNOSIS — Z9889 Other specified postprocedural states: Secondary | ICD-10-CM

## 2017-04-30 DIAGNOSIS — F418 Other specified anxiety disorders: Secondary | ICD-10-CM | POA: Diagnosis not present

## 2017-04-30 DIAGNOSIS — R109 Unspecified abdominal pain: Secondary | ICD-10-CM | POA: Diagnosis present

## 2017-04-30 DIAGNOSIS — I10 Essential (primary) hypertension: Secondary | ICD-10-CM | POA: Diagnosis not present

## 2017-04-30 DIAGNOSIS — K219 Gastro-esophageal reflux disease without esophagitis: Secondary | ICD-10-CM | POA: Diagnosis not present

## 2017-04-30 DIAGNOSIS — K5730 Diverticulosis of large intestine without perforation or abscess without bleeding: Secondary | ICD-10-CM | POA: Diagnosis not present

## 2017-04-30 DIAGNOSIS — Z8249 Family history of ischemic heart disease and other diseases of the circulatory system: Secondary | ICD-10-CM | POA: Insufficient documentation

## 2017-04-30 DIAGNOSIS — N840 Polyp of corpus uteri: Secondary | ICD-10-CM | POA: Insufficient documentation

## 2017-04-30 DIAGNOSIS — M1990 Unspecified osteoarthritis, unspecified site: Secondary | ICD-10-CM | POA: Diagnosis not present

## 2017-04-30 DIAGNOSIS — N9489 Other specified conditions associated with female genital organs and menstrual cycle: Secondary | ICD-10-CM

## 2017-04-30 DIAGNOSIS — E785 Hyperlipidemia, unspecified: Secondary | ICD-10-CM | POA: Insufficient documentation

## 2017-04-30 DIAGNOSIS — R1032 Left lower quadrant pain: Secondary | ICD-10-CM | POA: Diagnosis not present

## 2017-04-30 DIAGNOSIS — R9389 Abnormal findings on diagnostic imaging of other specified body structures: Secondary | ICD-10-CM

## 2017-04-30 DIAGNOSIS — N882 Stricture and stenosis of cervix uteri: Secondary | ICD-10-CM | POA: Diagnosis not present

## 2017-04-30 DIAGNOSIS — F329 Major depressive disorder, single episode, unspecified: Secondary | ICD-10-CM | POA: Insufficient documentation

## 2017-04-30 DIAGNOSIS — Z8261 Family history of arthritis: Secondary | ICD-10-CM | POA: Insufficient documentation

## 2017-04-30 HISTORY — PX: HYSTEROSCOPY W/D&C: SHX1775

## 2017-04-30 SURGERY — DILATATION AND CURETTAGE /HYSTEROSCOPY
Anesthesia: General | Wound class: Clean Contaminated

## 2017-04-30 MED ORDER — ACETAMINOPHEN NICU IV SYRINGE 10 MG/ML
INTRAVENOUS | Status: AC
  Filled 2017-04-30: qty 1

## 2017-04-30 MED ORDER — LACTATED RINGERS IV SOLN
INTRAVENOUS | Status: DC
  Administered 2017-04-30: 09:00:00 via INTRAVENOUS

## 2017-04-30 MED ORDER — OXYCODONE-ACETAMINOPHEN 5-325 MG PO TABS: 1.0000 | ORAL_TABLET | ORAL | 0 refills | Status: DC | PRN

## 2017-04-30 MED ORDER — OXYCODONE-ACETAMINOPHEN 5-325 MG PO TABS
ORAL_TABLET | ORAL | Status: AC
  Administered 2017-04-30: 1 via ORAL
  Filled 2017-04-30: qty 1

## 2017-04-30 MED ORDER — ACETAMINOPHEN 10 MG/ML IV SOLN
INTRAVENOUS | Status: DC | PRN
Start: 2017-04-30 — End: 2017-04-30
  Administered 2017-04-30: 1000 mg via INTRAVENOUS

## 2017-04-30 MED ORDER — PROPOFOL 10 MG/ML IV BOLUS
INTRAVENOUS | Status: DC | PRN
  Administered 2017-04-30: 150 mg via INTRAVENOUS

## 2017-04-30 MED ORDER — DEXAMETHASONE SODIUM PHOSPHATE 10 MG/ML IJ SOLN
INTRAMUSCULAR | Status: AC
  Filled 2017-04-30: qty 1

## 2017-04-30 MED ORDER — PROPOFOL 10 MG/ML IV BOLUS
INTRAVENOUS | Status: AC
  Filled 2017-04-30: qty 20

## 2017-04-30 MED ORDER — SUCCINYLCHOLINE CHLORIDE 20 MG/ML IJ SOLN
INTRAMUSCULAR | Status: AC
  Filled 2017-04-30: qty 1

## 2017-04-30 MED ORDER — ONDANSETRON HCL 4 MG/2ML IJ SOLN
INTRAMUSCULAR | Status: DC | PRN
  Administered 2017-04-30: 4 mg via INTRAVENOUS

## 2017-04-30 MED ORDER — MIDAZOLAM HCL 2 MG/2ML IJ SOLN
INTRAMUSCULAR | Status: AC
  Filled 2017-04-30: qty 2

## 2017-04-30 MED ORDER — SODIUM CHLORIDE 0.9 % IV SOLN: INTRAVENOUS | Status: DC

## 2017-04-30 MED ORDER — FENTANYL CITRATE (PF) 100 MCG/2ML IJ SOLN: 25.0000 ug | INTRAMUSCULAR | Status: DC | PRN

## 2017-04-30 MED ORDER — DEXAMETHASONE SODIUM PHOSPHATE 10 MG/ML IJ SOLN
INTRAMUSCULAR | Status: DC | PRN
  Administered 2017-04-30: 10 mg via INTRAVENOUS

## 2017-04-30 MED ORDER — OXYCODONE-ACETAMINOPHEN 5-325 MG PO TABS
1.0000 | ORAL_TABLET | ORAL | Status: DC | PRN
  Administered 2017-04-30: 1 via ORAL

## 2017-04-30 MED ORDER — FENTANYL CITRATE (PF) 100 MCG/2ML IJ SOLN
INTRAMUSCULAR | Status: DC | PRN
  Administered 2017-04-30: 50 ug via INTRAVENOUS

## 2017-04-30 MED ORDER — MIDAZOLAM HCL 2 MG/2ML IJ SOLN
INTRAMUSCULAR | Status: DC | PRN
  Administered 2017-04-30: 2 mg via INTRAVENOUS

## 2017-04-30 MED ORDER — ONDANSETRON HCL 4 MG/2ML IJ SOLN: 4.0000 mg | Freq: Once | INTRAMUSCULAR | Status: DC | PRN

## 2017-04-30 MED ORDER — ONDANSETRON HCL 4 MG/2ML IJ SOLN
INTRAMUSCULAR | Status: AC
  Filled 2017-04-30: qty 2

## 2017-04-30 MED ORDER — FENTANYL CITRATE (PF) 100 MCG/2ML IJ SOLN
INTRAMUSCULAR | Status: AC
  Filled 2017-04-30: qty 2

## 2017-04-30 SURGICAL SUPPLY — 15 items
BAG INFUSER PRESSURE 100CC (MISCELLANEOUS) ×3 IMPLANT
CATH ROBINSON RED A/P 16FR (CATHETERS) ×3 IMPLANT
DEVICE MYOSURE LITE (MISCELLANEOUS) ×3 IMPLANT
GLOVE BIO SURGEON STRL SZ8 (GLOVE) ×6 IMPLANT
GLOVE BIOGEL PI IND STRL 6.5 (GLOVE) ×1 IMPLANT
GLOVE BIOGEL PI INDICATOR 6.5 (GLOVE) ×2
GOWN STRL REUS W/ TWL LRG LVL3 (GOWN DISPOSABLE) ×2 IMPLANT
GOWN STRL REUS W/ TWL XL LVL3 (GOWN DISPOSABLE) ×1 IMPLANT
GOWN STRL REUS W/TWL LRG LVL3 (GOWN DISPOSABLE) ×4
GOWN STRL REUS W/TWL XL LVL3 (GOWN DISPOSABLE) ×2
IV LACTATED RINGERS 1000ML (IV SOLUTION) ×3 IMPLANT
KIT RM TURNOVER CYSTO AR (KITS) ×3 IMPLANT
PACK DNC HYST (MISCELLANEOUS) ×3 IMPLANT
PAD OB MATERNITY 4.3X12.25 (PERSONAL CARE ITEMS) ×3 IMPLANT
PAD PREP 24X41 OB/GYN DISP (PERSONAL CARE ITEMS) ×3 IMPLANT

## 2017-04-30 NOTE — Discharge Instructions (Signed)

## 2017-04-30 NOTE — Anesthesia Procedure Notes (Signed)
Procedure Name: LMA Insertion Date/Time: 04/30/2017 9:46 AM Performed by: Jonna Clark Pre-anesthesia Checklist: Patient identified, Patient being monitored, Timeout performed, Emergency Drugs available and Suction available Patient Re-evaluated:Patient Re-evaluated prior to induction Oxygen Delivery Method: Circle system utilized Preoxygenation: Pre-oxygenation with 100% oxygen Induction Type: IV induction Ventilation: Mask ventilation without difficulty LMA: LMA inserted LMA Size: 3.5 Tube type: Oral Number of attempts: 1 Placement Confirmation: positive ETCO2 and breath sounds checked- equal and bilateral Tube secured with: Tape Dental Injury: Teeth and Oropharynx as per pre-operative assessment

## 2017-04-30 NOTE — Transfer of Care (Signed)
Immediate Anesthesia Transfer of Care Note  Patient: Christine Mullins  Procedure(s) Performed: DILATATION AND CURETTAGE /HYSTEROSCOPY (N/A )  Patient Location: PACU  Anesthesia Type:General  Level of Consciousness: sedated and responds to stimulation  Airway & Oxygen Therapy: Patient Spontanous Breathing and Patient connected to face mask oxygen  Post-op Assessment: Report given to RN and Post -op Vital signs reviewed and stable  Post vital signs: Reviewed and stable  Last Vitals:  Vitals:   04/30/17 0824 04/30/17 1039  BP: 139/69 116/72  Pulse: 73 82  Resp: 18 19  Temp: 36.6 C (!) 36.1 C  SpO2: 100% 98%    Last Pain:  Vitals:   04/30/17 0824  TempSrc: Oral         Complications: No apparent anesthesia complications

## 2017-04-30 NOTE — Op Note (Signed)
OPERATIVE NOTE: GLENOLA WHEAT PROCEDURE DATE: 04/30/2017   PREOPERATIVE DIAGNOSIS:  1. Abdominal pelvic pain 2. Abnormal pelvic ultrasound with endometrial fluid collection and mass 3. Cervical stenosis  POSTOPERATIVE DIAGNOSIS:  1. Abdominal pelvic pain 2. Abnormal pelvic ultrasound with endometrial fluid collection and mass 3. Cervical stenosis 4. Endometrial lesion (polyp versus myoma) 5. Incidental uterine perforation PROCEDURE:  1. Hysteroscopy/D&C with myosure excision of endometrial lesion  SURGEON:  Brayton Mars, MD ASSISTANTS: None ANESTHESIA: General INDICATIONS: 71 y.o. G2P1101 with long history of left lower quadrant abdominal pelvic pain, known history of diverticular disease, presents for surgical evaluation of endometrial fluid collection and endometrial cavity lesion identified on recent ultrasound. Endometrial biopsy in the office could not be completed due to cervical stenosis.  FINDINGS:   Exam under anesthesia revealed a stenotic introitus; small midplane uterus is palpated on single digit exam. Cervical stenosis is noted requiring endocervical canal dilation with lacrimal duct probes. Incidental uterine perforation in the midline was noted during the dilation process. NO significant bleeding was encountered nor deviation in vital signs which could suggest patient compromise. A lower uterine segment 1 cm lesion was identified on hysteroscopy, suspicious for either polyp versus myoma. The myosure instrument was used to resect the lesion.   I/O's: Total I/O In: -  Out: 75 [Urine:75] COUNTS:  YES SPECIMENS: Endometrial curettings; 1. Endometrial curettings 2. Endometrial mass  ANTIBIOTIC PROPHYLAXIS:N/A COMPLICATIONS: None immediate  PROCEDURE IN DETAIL: The patient was brought to the operating room placed in supine position. General endotracheal anesthesia was induced without difficulty. She was placed in the dorsal lithotomy position using the  candycane stirrups. A ChloraPrep and Hibiclens perineal intravaginal prep and drape was performed in standard fashion. Timeout was completed. The Sims speculum was placed in the vagina due to the snug introitus that was noted on exam under anesthesia. Single-tooth tenaculum was used to grasp the anterior lip of the cervix. The cervix was stenotic and could not pass regular sized sound or dilators. Lacrimal duct probes were then used to dilate the endocervical canal. Loss of resistance was noted initially with the depth of the cavity being approximately 4-5 cm. However further dilation led to the suspected a midline perforation. No significant bleeding was encountered. The Miya sure scope using lactated Ringer's as irrigant was used to assess the intrauterine cavity. The right ostia was visualized. The septal perforation was visualized without active bleeding. The left ostia could not be identified. The 1 cm lower uterine segment lesion was identified and isolated. The Miya sure instrument was then used following simple curettage with a serrated curette to remove the 1 cm lesion. Both the endometrial curettings and the lower uterine segment mass were sent to pathology. Final survey of the uterine cavity revealed no significant residual tissue left behind. There was no active bleeding from the perforation site in the midline fundus. The patient's vital signs remained stable throughout the procedure. Procedure was then terminated with all instrumentation being removed from the vagina. Patient was then awakened mobilized and taken to recovery room in satisfactory condition.  EBL: Less than 10 cc Urine output: 75 cc IV fluids: See anesthesia worksheet  Hassell Done A. Zipporah Plants, MD, ACO[facility]

## 2017-04-30 NOTE — Anesthesia Post-op Follow-up Note (Signed)
Anesthesia QCDR form completed.        

## 2017-04-30 NOTE — Interval H&P Note (Signed)
History and Physical Interval Note:  04/30/2017 9:20 AM  Christine Mullins  has presented today for surgery, with the diagnosis of ABNORMAL ULTRASOUND, ENDOMETRIAL MASS, CERVICAL STENOSIS, LOWER ABDOMINAL PAIN  The various methods of treatment have been discussed with the patient and family. After consideration of risks, benefits and other options for treatment, the patient has consented to  Procedure(s): DILATATION AND CURETTAGE /HYSTEROSCOPY (N/A) as a surgical intervention .  The patient's history has been reviewed, patient examined, no change in status, stable for surgery.  I have reviewed the patient's chart and labs.  Questions were answered to the patient's satisfaction.     Hassell Done A Tiawana Forgy

## 2017-04-30 NOTE — H&P (View-Only) (Signed)
Subjective: PREOPERATIVE HISTORY AND PHYSICAL   date of surgery: 04/30/2017 Procedure: Hysteroscopy/D&C Diagnoses: 1. Lower abdominal pain, left lower quadrant Abnormal pelvic ultrasound with endometrial mass 3. Cervical Stenosis   Patient is a 71 y.o. [id number] scheduled for  Surgery on 04/30/2017. Patient has long history of left lower quadrant abdominal pelvic pain. Preoperative pelvic ultrasound demonstrated a possible endometrial mass and fluid collection within the uterine cavity. Endometrial biopsy could not be accomplished due to cervical stenosis and patient intolerance. Patient presents for hysteroscopy/D&C  Patient with long history of diverticulosis/diverticulitis and chronic left lower quadrant pain,  Over the past 3-4 years patient has had a persistent intermittent left lower quadrant pain described as a squeezing sensation which can last from hours to days. She has a at least 4 episodes per year. In 2011 she was hospitalized in [place] for acute diverticulitis. Samira has a colonoscopy scheduled for next week. Bowel function is reportedly normal. Bladder function is normal. She denies vaginal discharge or postmenopausal bleeding. She has no history of abnormal Pap smears.  03/08/2017 ultrasound:  FINDINGS: Uterus  Measurements: 4.4 x 1.7 x 2.2 cm. No fibroids or other mass visualized.  Endometrium  Thickness: 2.5 mm. There is fluid in the endometrial canal. A rounded echogenic region could represent tumefactive debris or a mass/polyp. This rounded region measures 4.4 x 2.6 x 3.5 mm  Right ovary  Not visualized.  Left ovary  Not visualized.  Other findings  No abnormal free fluid.  IMPRESSION: 1. There is fluid in the endometrial canal. A rounded 4.4 x 2.6 x 3.5 cm hypoechoic region could represent a polyp/mass versus tumefactive debris. Recommend gynecologic consultation with hysterosonography for further evaluation. 2. The ovaries were not  visualized. 3. No other abnormalities  OB History    Gravida Para Term Preterm AB Living   2 2 1 1  0 1   SAB TAB Ectopic Multiple Live Births   0       1     No LMP recorded. Patient is postmenopausal.    Past Medical History:  Diagnosis Date  . Arthritis   . Depression   . GERD (gastroesophageal reflux disease)   . Hx of diverticulitis of colon   . Hyperlipidemia   . Hypertension     Past Surgical History:  Procedure Laterality Date  . BREAST BIOPSY Left 1995   negative  . CESAREAN SECTION    . FOOT SURGERY Bilateral     OB History  Gravida Para Term Preterm AB Living  2 2 1 1  0 1  SAB TAB Ectopic Multiple Live Births  0       1    # Outcome Date GA Lbr Len/2nd Weight Sex Delivery Anes PTL Lv  2 Term 1976   6 lb 1.6 oz (2.767 kg) M CS-LTranv   LIV  1 Preterm 48        FD      Social History   Social History  . Marital status: Single    Spouse name: N/A  . Number of children: N/A  . Years of education: N/A   Social History Main Topics  . Smoking status: Never Smoker  . Smokeless tobacco: Never Used  . Alcohol use Yes     Comment: social  . Drug use: No  . Sexual activity: Not Currently    Birth control/ protection: Post-menopausal   Other Topics Concern  . None   Social History Narrative  . None    Family History  Problem Relation Age of Onset  . Arthritis Mother   . Hyperlipidemia Mother   . Hyperlipidemia Father   . Heart failure Father   . Breast cancer Neg Hx   . Ovarian cancer Neg Hx   . Colon cancer Neg Hx      (Not in a hospital admission)  No Known Allergies  Review of Systems Constitutional: No recent fever/chills/sweats Respiratory: No recent cough/bronchitis Cardiovascular: No chest pain Gastrointestinal: No recent nausea/vomiting/diarrhea Genitourinary: No UTI symptoms Hematologic/lymphatic:No history of coagulopathy or recent blood thinner use    Objective:    BP 134/80   Pulse 85   Ht 5\' 2"  (1.575 m)   Wt  142 lb 4.8 oz (64.5 kg)   BMI 26.03 kg/m   General:   Normal  Skin:   normal  HEENT:  Normal  Neck:  Supple without Adenopathy or Thyromegaly  Lungs:   Heart:              Breasts:   Abdomen:  Pelvis:  M/S   Extremeties:  Neuro:    clear to auscultation bilaterally   Normal without murmur   Not Examined   soft, non-tender; bowel sounds normal; no masses,  no organomegaly   Exam deferred to OR  No CVAT  Warm/Dry   Normal         04/18/2017 PELVIC:             External Genitalia: Normal             BUS: Normal             Vagina: Mild to moderate atrophy             Cervix: Stenotic; no lesions; no cervical motion tenderness             Uterus: Small, midplane, Normal shape,consistency, mobile             Adnexa: Normal; nonpalpable nontender             RV: Normal external exam             Bladder: Nontender Assessment:      1. Abnormal ultrasound-Endometrial fluid collection with possible mass  2. Endometrial mass  3. Cervical stenosis (uterine cervix)  4. Lower abdominal pain, left lower quadrant  5. Diverticulosis of colon without hemorrhage      Plan:  Hysteroscopy/D&C    Prep  Consent: The patient is to undergo hysteroscopy/D&C for evaluation of  abnormalpelvic ultrasound notable for abnormal fluid collection and possible endometrial mass. She has history of chronic left lower quadrant pain. The patient is aware of and is accepting of all surgical risks which include but are not limited to bleeding, infection, pelvic organ injury with need for repair, blood clot disorders, anesthesia risks, etc. All questions have been answered. Informed consent is given. Patient is ready and willing to proceed with surgery as scheduled.  Brayton Mars, MD  Note: This dictation was prepared with Dragon dictation along with smaller phrase technology. Any transcriptional errors that result from this process are unintentional.

## 2017-04-30 NOTE — Anesthesia Postprocedure Evaluation (Signed)
Anesthesia Post Note  Patient: Christine Mullins  Procedure(s) Performed: DILATATION AND CURETTAGE /HYSTEROSCOPY (N/A )  Patient location during evaluation: PACU Anesthesia Type: General Level of consciousness: awake and alert and oriented Pain management: pain level controlled Vital Signs Assessment: post-procedure vital signs reviewed and stable Respiratory status: spontaneous breathing Cardiovascular status: blood pressure returned to baseline Anesthetic complications: no     Last Vitals:  Vitals:   04/30/17 1054 04/30/17 1109  BP: (!) 157/78 130/81  Pulse: 95 73  Resp: (!) 21 13  Temp:    SpO2: 100% 100%    Last Pain:  Vitals:   04/30/17 1109  TempSrc:   PainSc: Asleep                 Taygan Connell

## 2017-04-30 NOTE — Anesthesia Preprocedure Evaluation (Signed)
Anesthesia Evaluation  Patient identified by MRN, date of birth, ID band Patient awake    Reviewed: Allergy & Precautions, NPO status , Patient's Chart, lab work & pertinent test results  Airway Mallampati: II  TM Distance: >3 FB     Dental  (+) Teeth Intact   Pulmonary neg pulmonary ROS,    Pulmonary exam normal        Cardiovascular hypertension, Pt. on medications Normal cardiovascular exam     Neuro/Psych PSYCHIATRIC DISORDERS Anxiety Depression negative neurological ROS     GI/Hepatic Neg liver ROS, GERD  Medicated and Controlled,  Endo/Other  negative endocrine ROS  Renal/GU negative Renal ROS  Female GU complaint     Musculoskeletal  (+) Arthritis , Osteoarthritis,    Abdominal Normal abdominal exam  (+)   Peds negative pediatric ROS (+)  Hematology negative hematology ROS (+)   Anesthesia Other Findings   Reproductive/Obstetrics                             Anesthesia Physical Anesthesia Plan  ASA: II  Anesthesia Plan: General   Post-op Pain Management:    Induction: Intravenous  PONV Risk Score and Plan:   Airway Management Planned: LMA  Additional Equipment:   Intra-op Plan:   Post-operative Plan: Extubation in OR  Informed Consent: I have reviewed the patients History and Physical, chart, labs and discussed the procedure including the risks, benefits and alternatives for the proposed anesthesia with the patient or authorized representative who has indicated his/her understanding and acceptance.   Dental advisory given  Plan Discussed with: CRNA and Surgeon  Anesthesia Plan Comments:         Anesthesia Quick Evaluation

## 2017-05-01 LAB — SURGICAL PATHOLOGY

## 2017-05-08 ENCOUNTER — Encounter: Payer: Self-pay | Admitting: Obstetrics and Gynecology

## 2017-05-08 ENCOUNTER — Ambulatory Visit (INDEPENDENT_AMBULATORY_CARE_PROVIDER_SITE_OTHER): Payer: Medicare Other | Admitting: Obstetrics and Gynecology

## 2017-05-08 VITALS — BP 109/69 | HR 98 | Ht 61.0 in | Wt 144.8 lb

## 2017-05-08 DIAGNOSIS — N9489 Other specified conditions associated with female genital organs and menstrual cycle: Secondary | ICD-10-CM

## 2017-05-08 DIAGNOSIS — Z9889 Other specified postprocedural states: Secondary | ICD-10-CM

## 2017-05-08 DIAGNOSIS — Z09 Encounter for follow-up examination after completed treatment for conditions other than malignant neoplasm: Secondary | ICD-10-CM | POA: Diagnosis not present

## 2017-05-08 DIAGNOSIS — N882 Stricture and stenosis of cervix uteri: Secondary | ICD-10-CM

## 2017-05-08 DIAGNOSIS — Z23 Encounter for immunization: Secondary | ICD-10-CM

## 2017-05-08 NOTE — Patient Instructions (Addendum)
1.  Continue with Dr. Nicki Reaper for routine gynecologic care 2.  Return here for any gynecologic problems in the future as needed  Influenza (Flu) Vaccine (Inactivated or Recombinant): What You Need to Know 1. Why get vaccinated? Influenza ("flu") is a contagious disease that spreads around the [place] every year, usually between October and May. Flu is caused by influenza viruses, and is spread mainly by coughing, sneezing, and close contact. Anyone can get flu. Flu strikes suddenly and can last several days. Symptoms vary by age, but can include:  fever/chills  sore throat  muscle aches  fatigue  cough  headache  runny or stuffy nose  Flu can also lead to pneumonia and blood infections, and cause diarrhea and seizures in children. If you have a medical condition, such as heart or lung disease, flu can make it worse. Flu is more dangerous for some people. Infants and young children, people 69 years of age and older, pregnant women, and people with certain health conditions or a weakened immune system are at greatest risk. Each year thousands of people in the [place] die from flu, and many more are hospitalized. Flu vaccine can:  keep you from getting flu,  make flu less severe if you do get it, and  keep you from spreading flu to your family and other people. 2. Inactivated and recombinant flu vaccines A dose of flu vaccine is recommended every flu season. Children 6 months through 42 years of age may need two doses during the same flu season. Everyone else needs only one dose each flu season. Some inactivated flu vaccines contain a very small amount of a mercury-based preservative called thimerosal. Studies have not shown thimerosal in vaccines to be harmful, but flu vaccines that do not contain thimerosal are available. There is no live flu virus in flu shots. They cannot cause the flu. There are many flu viruses, and they are always changing. Each year a new flu  vaccine is made to protect against three or four viruses that are likely to cause disease in the upcoming flu season. But even when the vaccine doesn't exactly match these viruses, it may still provide some protection. Flu vaccine cannot prevent:  flu that is caused by a virus not covered by the vaccine, or  illnesses that look like flu but are not.  It takes about 2 weeks for protection to develop after vaccination, and protection lasts through the flu season. 3. Some people should not get this vaccine Tell the person who is giving you the vaccine:  If you have any severe, life-threatening allergies. If you ever had a life-threatening allergic reaction after a dose of flu vaccine, or have a severe allergy to any part of this vaccine, you may be advised not to get vaccinated. Most, but not all, types of flu vaccine contain a small amount of egg protein.  If you ever had Guillain-Barr Syndrome (also called GBS). Some people with a history of GBS should not get this vaccine. This should be discussed with your doctor.  If you are not feeling well. It is usually okay to get flu vaccine when you have a mild illness, but you might be asked to come back when you feel better.  4. Risks of a vaccine reaction With any medicine, including vaccines, there is a chance of reactions. These are usually mild and go away on their own, but serious reactions are also possible. Most people who get a flu shot do not have any  problems with it. Minor problems following a flu shot include:  soreness, redness, or swelling where the shot was given  hoarseness  sore, red or itchy eyes  cough  fever  aches  headache  itching  fatigue  If these problems occur, they usually begin soon after the shot and last 1 or 2 days. More serious problems following a flu shot can include the following:  There may be a small increased risk of Guillain-Barre Syndrome (GBS) after inactivated flu vaccine. This risk has  been estimated at 1 or 2 additional cases per million people vaccinated. This is much lower than the risk of severe complications from flu, which can be prevented by flu vaccine.  Young children who get the flu shot along with pneumococcal vaccine (PCV13) and/or DTaP vaccine at the same time might be slightly more likely to have a seizure caused by fever. Ask your doctor for more information. Tell your doctor if a child who is getting flu vaccine has ever had a seizure.  Problems that could happen after any injected vaccine:  People sometimes faint after a medical procedure, including vaccination. Sitting or lying down for about 15 minutes can help prevent fainting, and injuries caused by a fall. Tell your doctor if you feel dizzy, or have vision changes or ringing in the ears.  Some people get severe pain in the shoulder and have difficulty moving the arm where a shot was given. This happens very rarely.  Any medication can cause a severe allergic reaction. Such reactions from a vaccine are very rare, estimated at about 1 in a million doses, and would happen within a few minutes to a few hours after the vaccination. As with any medicine, there is a very remote chance of a vaccine causing a serious injury or death. The safety of vaccines is always being monitored. For more information, visit: http://www.aguilar.org/ 5. What if there is a serious reaction? What should I look for? Look for anything that concerns you, such as signs of a severe allergic reaction, very high fever, or unusual behavior. Signs of a severe allergic reaction can include hives, swelling of the face and throat, difficulty breathing, a fast heartbeat, dizziness, and weakness. These would start a few minutes to a few hours after the vaccination. What should I do?  If you think it is a severe allergic reaction or other emergency that can't wait, call 9-1-1 and get the person to the nearest hospital. Otherwise, call your  doctor.  Reactions should be reported to the Vaccine Adverse Event Reporting System (VAERS). Your doctor should file this report, or you can do it yourself through the VAERS web site at www.vaers.SamedayNews.es, or by calling (782) 672-0503. ? VAERS does not give medical advice. 6. The National Vaccine Injury Compensation Program The [employer] Vaccine Injury Compensation Program (VICP) is a federal program that was created to compensate people who may have been injured by certain vaccines. Persons who believe they may have been injured by a vaccine can learn about the program and about filing a claim by calling 657-311-5644 or visiting the [place] website at GoldCloset.com.ee. There is a time limit to file a claim for compensation. 7. How can I learn more?  Ask your healthcare provider. He or she can give you the vaccine package insert or suggest other sources of information.  Call your local or state health department.  Contact the Centers for Disease Control and Prevention (CDC): ? Call 7863623197 (1-800-CDC-INFO) or ? Visit CDC's website at https://gibson.com/ Vaccine  Information Statement, Inactivated Influenza Vaccine (02/13/2014) This information is not intended to replace advice given to you by your health care provider. Make sure you discuss any questions you have with your health care provider. Document Released: 04/20/2006 Document Revised: 03/16/2016 Document Reviewed: 03/16/2016 Elsevier Interactive Patient Education  2017 [employer].

## 2017-05-08 NOTE — Progress Notes (Signed)
Chief complaint: 1.  Postop check 2.  Status post hysteroscopy/D&C  Patient presents for her postop check following surgery.  She has done well and now has normal bowel and bladder function.  She still continues to have lower left quadrant twinges of discomfort which are likely secondary to her diverticular disease.  Pathology from surgery has been reviewed  PATHOLOGY: DIAGNOSIS:  A. ENDOMETRIUM; DILATATION AND CURETTAGE:  - FRAGMENTS OF WEAKLY PROLIFERATIVE ENDOMETRIUM.  - BENIGN CERVICAL TISSUE.   B. ENDOMETRIAL LESION; BIOPSY:  - CONSISTENT WITH BENIGN ENDOMETRIAL POLYP WITH FOCAL TUBAL METAPLASIA.  - NEGATIVE FOR ATYPIA AND MALIGNANCY.    OBJECTIVE: BP 109/69   Pulse 98   Ht 5\' 1"  (1.549 m)   Wt 144 lb 12.8 oz (65.7 kg)   BMI 27.36 kg/m  Physical exam-deferred  ASSESSMENT: 1.  Normal postop check status post hysteroscopy/D&C 2.  Endometrial mass-pathology consistent with polyp 3.  Chronic left lower quadrant pain, likely secondary to diverticular disease  PLAN: 1.  Resume activities as tolerated 2.  Return to Dr. Nicki Reaper for routine wellness care. 3.  Return here for any gynecologic issues in the future as needed. 4.  Flu shot is given today  Brayton Mars, MD  Note: This dictation was prepared with Dragon dictation along with smaller phrase technology. Any transcriptional errors that result from this process are unintentional.

## 2017-05-15 ENCOUNTER — Ambulatory Visit: Payer: Medicare Other | Admitting: Certified Registered Nurse Anesthetist

## 2017-05-15 ENCOUNTER — Encounter
Admission: RE | Disposition: A | Discharge status: Home / Self Care | Payer: Self-pay | Source: Ambulatory Visit | Attending: Gastroenterology

## 2017-05-15 ENCOUNTER — Ambulatory Visit
Admission: RE | Admit: 2017-05-15 | Discharge: 2017-05-15 | Disposition: A | Discharge status: Home / Self Care | Payer: Medicare Other | Source: Ambulatory Visit | Attending: Gastroenterology | Admitting: Gastroenterology

## 2017-05-15 ENCOUNTER — Encounter: Payer: Self-pay | Admitting: *Deleted

## 2017-05-15 DIAGNOSIS — K648 Other hemorrhoids: Secondary | ICD-10-CM

## 2017-05-15 DIAGNOSIS — M1990 Unspecified osteoarthritis, unspecified site: Secondary | ICD-10-CM | POA: Insufficient documentation

## 2017-05-15 DIAGNOSIS — F419 Anxiety disorder, unspecified: Secondary | ICD-10-CM | POA: Insufficient documentation

## 2017-05-15 DIAGNOSIS — Z8719 Personal history of other diseases of the digestive system: Secondary | ICD-10-CM | POA: Insufficient documentation

## 2017-05-15 DIAGNOSIS — Z86010 Personal history of colonic polyps: Secondary | ICD-10-CM

## 2017-05-15 DIAGNOSIS — D122 Benign neoplasm of ascending colon: Secondary | ICD-10-CM | POA: Insufficient documentation

## 2017-05-15 DIAGNOSIS — E785 Hyperlipidemia, unspecified: Secondary | ICD-10-CM | POA: Insufficient documentation

## 2017-05-15 DIAGNOSIS — D120 Benign neoplasm of cecum: Secondary | ICD-10-CM | POA: Insufficient documentation

## 2017-05-15 DIAGNOSIS — K219 Gastro-esophageal reflux disease without esophagitis: Secondary | ICD-10-CM | POA: Diagnosis not present

## 2017-05-15 DIAGNOSIS — F329 Major depressive disorder, single episode, unspecified: Secondary | ICD-10-CM | POA: Diagnosis not present

## 2017-05-15 DIAGNOSIS — Z7982 Long term (current) use of aspirin: Secondary | ICD-10-CM | POA: Insufficient documentation

## 2017-05-15 DIAGNOSIS — K5730 Diverticulosis of large intestine without perforation or abscess without bleeding: Secondary | ICD-10-CM | POA: Diagnosis not present

## 2017-05-15 DIAGNOSIS — K644 Residual hemorrhoidal skin tags: Secondary | ICD-10-CM | POA: Insufficient documentation

## 2017-05-15 DIAGNOSIS — K5790 Diverticulosis of intestine, part unspecified, without perforation or abscess without bleeding: Secondary | ICD-10-CM | POA: Diagnosis not present

## 2017-05-15 DIAGNOSIS — I10 Essential (primary) hypertension: Secondary | ICD-10-CM | POA: Diagnosis not present

## 2017-05-15 DIAGNOSIS — Z1211 Encounter for screening for malignant neoplasm of colon: Secondary | ICD-10-CM | POA: Diagnosis not present

## 2017-05-15 DIAGNOSIS — Z79899 Other long term (current) drug therapy: Secondary | ICD-10-CM | POA: Diagnosis not present

## 2017-05-15 DIAGNOSIS — K635 Polyp of colon: Secondary | ICD-10-CM | POA: Diagnosis not present

## 2017-05-15 HISTORY — PX: COLONOSCOPY WITH PROPOFOL: SHX5780

## 2017-05-15 SURGERY — COLONOSCOPY WITH PROPOFOL
Anesthesia: General

## 2017-05-15 MED ORDER — MIDAZOLAM HCL 2 MG/2ML IJ SOLN
INTRAMUSCULAR | Status: DC | PRN
  Administered 2017-05-15: 2 mg via INTRAVENOUS

## 2017-05-15 MED ORDER — ONDANSETRON HCL 4 MG/2ML IJ SOLN
INTRAMUSCULAR | Status: DC | PRN
  Administered 2017-05-15: 4 mg via INTRAVENOUS

## 2017-05-15 MED ORDER — MIDAZOLAM HCL 2 MG/2ML IJ SOLN
INTRAMUSCULAR | Status: AC
  Filled 2017-05-15: qty 2

## 2017-05-15 MED ORDER — LIDOCAINE HCL (CARDIAC) 20 MG/ML IV SOLN
INTRAVENOUS | Status: DC | PRN
  Administered 2017-05-15: 100 mg via INTRAVENOUS

## 2017-05-15 MED ORDER — PROPOFOL 500 MG/50ML IV EMUL
INTRAVENOUS | Status: DC | PRN
  Administered 2017-05-15: 100 ug/kg/min via INTRAVENOUS

## 2017-05-15 MED ORDER — SODIUM CHLORIDE 0.9 % IV SOLN
INTRAVENOUS | Status: DC
  Administered 2017-05-15: 15:00:00 via INTRAVENOUS

## 2017-05-15 MED ORDER — PROPOFOL 10 MG/ML IV BOLUS
INTRAVENOUS | Status: DC | PRN
Start: 2017-05-15 — End: 2017-05-15
  Administered 2017-05-15: 40 mg via INTRAVENOUS
  Administered 2017-05-15: 60 mg via INTRAVENOUS
  Administered 2017-05-15 (×2): 50 mg via INTRAVENOUS

## 2017-05-15 NOTE — H&P (Signed)
Cephas Darby, MD [address]  [place], [place] [address]  Main: [phone]  Fax: [phone] Pager: [phone]  Primary Care Physician:  Einar Pheasant, MD Primary Gastroenterologist:  Dr. Cephas Darby  Pre-Procedure History & Physical: HPI:  Christine Mullins is a 71 y.o. female is here for an colonoscopy.   Past Medical History:  Diagnosis Date  . Arthritis   . Depression   . GERD (gastroesophageal reflux disease)   . Hx of diverticulitis of colon   . Hyperlipidemia   . Hypertension     Past Surgical History:  Procedure Laterality Date  . BREAST BIOPSY Left 1995   negative  . CATARACT EXTRACTION, BILATERAL    . CESAREAN SECTION    . FOOT SURGERY Bilateral   . GANGLION CYST EXCISION Left 1965   wrist  . GANGLION CYST EXCISION Right 1980   hand  . KNEE ARTHROSCOPY Right 2000    Prior to Admission medications   Medication Sig Start Date End Date Taking? Authorizing Provider  aspirin EC 81 MG tablet Take 81 mg by mouth daily.    [provider]  benazepril (LOTENSIN) 10 MG tablet TAKE 1 TALBET BY MOUTH DAILY Patient taking differently: Take 10 mg by mouth daily 12/01/16   Einar Pheasant, MD  escitalopram (LEXAPRO) 10 MG tablet TAKE 1 TABLET BY MOUTH DAILY Patient taking differently: Take 10 mg by mouth daily 12/01/16   Einar Pheasant, MD  hydrochlorothiazide (HYDRODIURIL) 25 MG tablet TAKE 1 TABLET BY MOUTH DAILY Patient taking differently: Take 25 mg by mouth daily 12/01/16   Einar Pheasant, MD  ibuprofen (ADVIL,MOTRIN) 200 MG tablet Take 400 mg by mouth every 6 (six) hours as needed for headache or moderate pain.    [provider]  omeprazole (PRILOSEC) 20 MG capsule Take 1 capsule (20 mg total) by mouth daily. Patient taking differently: Take 40 mg by mouth daily.  04/24/16   Einar Pheasant, MD  Probiotic Product (PROBIOTIC PO) Take 1 tablet by mouth daily.    [provider]  simvastatin (ZOCOR) 40 MG tablet TAKE  1 TABLET BY MOUTH EVERY EVENING Patient taking differently: Take 40 mg by mouth every evening 07/19/16   Einar Pheasant, MD  zolpidem (AMBIEN) 10 MG tablet Take 1 tablet (10 mg total) by mouth at bedtime as needed. for sleep Patient taking differently: Take 10 mg by mouth at bedtime as needed for sleep.  04/18/17   Defrancesco, Alanda Slim, MD    Allergies as of 04/02/2017  . (No Known Allergies)    Family History  Problem Relation Age of Onset  . Arthritis Mother   . Hyperlipidemia Mother   . Hyperlipidemia Father   . Heart failure Father   . Breast cancer Neg Hx   . Ovarian cancer Neg Hx   . Colon cancer Neg Hx     Social History   Socioeconomic History  . Marital status: Single    Spouse name: Not on file  . Number of children: Not on file  . Years of education: Not on file  . Highest education level: Not on file  Social Needs  . Financial resource strain: Not on file  . Food insecurity - worry: Not on file  . Food insecurity - inability: Not on file  . Transportation needs - medical: Not on file  . Transportation needs - non-medical: Not on file  Occupational History  . Not on file  Tobacco Use  . Smoking status: Never  Smoker  . Smokeless tobacco: Never Used  Substance and Sexual Activity  . Alcohol use: Yes    Comment: social  . Drug use: No  . Sexual activity: Not Currently    Birth control/protection: Post-menopausal  Other Topics Concern  . Not on file  Social History Narrative  . Not on file    Review of Systems: See HPI, otherwise negative ROS  Physical Exam: BP (!) 149/65   Pulse 66   Temp (!) 97.4 F (36.3 C) (Tympanic)   Resp 20   Ht 5\' 1"  (1.549 m)   Wt 144 lb (65.3 kg)   SpO2 100%   BMI 27.21 kg/m  General:   Alert,  pleasant and cooperative in NAD Head:  Normocephalic and atraumatic. Neck:  Supple; no masses or thyromegaly. Lungs:  Clear throughout to auscultation.    Heart:  Regular rate and rhythm. Abdomen:  Soft, nontender and  nondistended. Normal bowel sounds, without guarding, and without rebound.   Neurologic:  Alert and  oriented x4;  grossly normal neurologically.  Impression/Plan: Christine Mullins is here for an colonoscopy to be performed for h/o colon polyp  Risks, benefits, limitations, and alternatives regarding  colonoscopy have been reviewed with the patient.  Questions have been answered.  All parties agreeable.   Sherri Sear, MD  05/15/2017, 2:15 PM

## 2017-05-15 NOTE — Anesthesia Post-op Follow-up Note (Signed)
Anesthesia QCDR form completed.        

## 2017-05-15 NOTE — Op Note (Signed)
[facility] Patient Name: Christine Mullins Procedure Date: 05/15/2017 2:35 PM MRN: [id number] Account #: [id number] Date of Birth: [birth year] Admit Type: Outpatient Age: 71 Room: [facility] ENDO ROOM 4 Gender: Female Note Status: Finalized Procedure:            Colonoscopy Indications:          High risk colon cancer surveillance: Personal history                        of colonic polyps, Last colonoscopy: August 2002 Providers:            Lin Landsman MD, MD Referring MD:         Einar Pheasant, MD (Referring MD) Medicines:            Monitored Anesthesia Care Complications:        No immediate complications. Estimated blood loss: None. Procedure:            Pre-Anesthesia Assessment:                       - Prior to the procedure, a History and Physical was                        performed, and patient medications and allergies were                        reviewed. The patient is competent. The risks and                        benefits of the procedure and the sedation options and                        risks were discussed with the patient. All questions                        were answered and informed consent was obtained.                        Patient identification and proposed procedure were                        verified by the physician, the nurse, the                        anesthesiologist, the anesthetist and the technician in                        the pre-procedure area in the procedure room. Mental                        Status Examination: alert and oriented. Airway                        Examination: normal oropharyngeal airway and neck                        mobility. Respiratory Examination: clear to                        auscultation. CV Examination: normal. Prophylactic  Antibiotics: The patient does not require prophylactic                        antibiotics. Prior Anticoagulants: The patient has                taken no previous anticoagulant or antiplatelet agents.                        ASA Grade Assessment: II - A patient with mild systemic                        disease. After reviewing the risks and benefits, the                        patient was deemed in satisfactory condition to undergo                        the procedure. The anesthesia plan was to use monitored                        anesthesia care (MAC). Immediately prior to                        administration of medications, the patient was                        re-assessed for adequacy to receive sedatives. The                        heart rate, respiratory rate, oxygen saturations, blood                        pressure, adequacy of pulmonary ventilation, and                        response to care were monitored throughout the                        procedure. The physical status of the patient was                        re-assessed after the procedure.                       After obtaining informed consent, the colonoscope was                        passed under direct vision. Throughout the procedure,                        the patient's blood pressure, pulse, and oxygen                        saturations were monitored continuously. The                        Colonoscope was introduced through the anus and                        advanced to the the terminal ileum. The colonoscopy was  extremely difficult due to multiple diverticula in the                        colon, restricted mobility of the colon and significant                        looping. Successful completion of the procedure was                        aided by changing the patient to a supine position,                        changing the patient to a prone position, using manual                        pressure, withdrawing the scope and replacing with the                        pediatric colonoscope, withdrawing the scope and                         replacing with the adult endoscope and applying                        abdominal pressure. The patient tolerated the procedure                        well. The quality of the bowel preparation was                        evaluated using the BBPS [facility] Bowel Preparation                        Scale) with scores of: Right Colon = 3, Transverse                        Colon = 3 and Left Colon = 3 (entire mucosa seen well                        with no residual staining, small fragments of stool or                        opaque liquid). The total BBPS score equals 9. Findings:      The perianal and digital rectal examinations were normal. Pertinent       negatives include normal sphincter tone and no palpable rectal lesions.      Scattered large-mouthed diverticula were found in the entire colon,       narrow rectosigmoid junction due to severe diverticulosis.      Non-bleeding external and internal hemorrhoids were found during       retroflexion. The hemorrhoids were medium-sized.      Two sessile polyps were found in the ascending colon and cecum. The       polyps were diminutive in size. These polyps were removed with a cold       biopsy forceps. Resection and retrieval were complete. Impression:           - Diverticulosis in the entire examined colon, advanced  to cecum with adult endoscope.                       - Non-bleeding external and internal hemorrhoids.                       - No specimens collected. Recommendation:       - Repeat colonoscopy in 5-10 years for surveillance                        based on pathology results, and with adult endoscope.                       - Await pathology results.                       - Discharge patient to home.                       - Resume previous diet today.                       - Continue present medications. Procedure Code(s):    --- Professional ---                       [phone], Colonoscopy,  flexible; with biopsy, single or                        multiple Diagnosis Code(s):    --- Professional ---                       Z86.010, Personal history of colonic polyps                       K64.8, Other hemorrhoids                       K57.30, Diverticulosis of large intestine without                        perforation or abscess without bleeding CPT copyright 2016 American Medical Association. All rights reserved. The codes documented in this report are preliminary and upon coder review may  be revised to meet current compliance requirements. Dr. Ulyess Mort Lin Landsman MD, MD 05/15/2017 3:50:13 PM This report has been signed electronically. Number of Addenda: 0 Note Initiated On: 05/15/2017 2:35 PM Scope Withdrawal Time: 0 hours 7 minutes 27 seconds  Total Procedure Duration: 0 hours 46 minutes 17 seconds       [facility]

## 2017-05-15 NOTE — Anesthesia Postprocedure Evaluation (Signed)
Anesthesia Post Note  Patient: Christine Mullins  Procedure(s) Performed: COLONOSCOPY WITH PROPOFOL (N/A )  Patient location during evaluation: Endoscopy Anesthesia Type: General Level of consciousness: awake and alert Pain management: pain level controlled Vital Signs Assessment: post-procedure vital signs reviewed and stable Respiratory status: spontaneous breathing, nonlabored ventilation, respiratory function stable and patient connected to nasal cannula oxygen Cardiovascular status: blood pressure returned to baseline and stable Postop Assessment: no apparent nausea or vomiting Anesthetic complications: no     Last Vitals:  Vitals:   05/15/17 1611 05/15/17 1621  BP: (!) 66/56 132/78  Pulse: 73 63  Resp: (!) 21 (!) 21  Temp:    SpO2: 100% 100%    Last Pain:  Vitals:   05/15/17 1411  TempSrc: Tympanic                 Martha Clan

## 2017-05-15 NOTE — Transfer of Care (Signed)
Immediate Anesthesia Transfer of Care Note  Patient: Christine Mullins  Procedure(s) Performed: COLONOSCOPY WITH PROPOFOL (N/A )  Patient Location: Endoscopy Unit  Anesthesia Type:General  Level of Consciousness: drowsy and patient cooperative  Airway & Oxygen Therapy: Patient Spontanous Breathing and Patient connected to nasal cannula oxygen  Post-op Assessment: Report given to RN, Post -op Vital signs reviewed and stable and Patient moving all extremities X 4  Post vital signs: Reviewed and stable  Last Vitals:  Vitals:   05/15/17 1411  BP: (!) 149/65  Pulse: 66  Resp: 20  Temp: (!) 36.3 C  SpO2: 100%    Last Pain:  Vitals:   05/15/17 1411  TempSrc: Tympanic      Patients Stated Pain Goal: 0 (99/77/41 4239)  Complications: No apparent anesthesia complications

## 2017-05-15 NOTE — Anesthesia Preprocedure Evaluation (Signed)
Anesthesia Evaluation  Patient identified by MRN, date of birth, ID band Patient awake    Reviewed: Allergy & Precautions, NPO status , Patient's Chart, lab work & pertinent test results  Airway Mallampati: II       Dental  (+) Teeth Intact   Pulmonary neg pulmonary ROS,    breath sounds clear to auscultation       Cardiovascular Exercise Tolerance: Good hypertension, Pt. on medications  Rhythm:Regular Rate:Normal     Neuro/Psych Anxiety Depression negative neurological ROS     GI/Hepatic Neg liver ROS, GERD  Medicated,  Endo/Other  negative endocrine ROS  Renal/GU negative Renal ROS     Musculoskeletal negative musculoskeletal ROS (+)   Abdominal Normal abdominal exam  (+)   Peds negative pediatric ROS (+)  Hematology negative hematology ROS (+)   Anesthesia Other Findings   Reproductive/Obstetrics                             Anesthesia Physical Anesthesia Plan  ASA: II  Anesthesia Plan: General   Post-op Pain Management:    Induction: Intravenous  PONV Risk Score and Plan: 3 and 0  Airway Management Planned: Natural Airway and Nasal Cannula  Additional Equipment:   Intra-op Plan:   Post-operative Plan:   Informed Consent: I have reviewed the patients History and Physical, chart, labs and discussed the procedure including the risks, benefits and alternatives for the proposed anesthesia with the patient or authorized representative who has indicated his/her understanding and acceptance.     Plan Discussed with: CRNA  Anesthesia Plan Comments:         Anesthesia Quick Evaluation

## 2017-05-16 ENCOUNTER — Encounter: Payer: Self-pay | Admitting: Gastroenterology

## 2017-05-17 LAB — SURGICAL PATHOLOGY

## 2017-05-18 ENCOUNTER — Encounter: Payer: Self-pay | Admitting: Gastroenterology

## 2017-05-28 ENCOUNTER — Ambulatory Visit
Admission: RE | Admit: 2017-05-28 | Discharge: 2017-05-28 | Disposition: A | Discharge status: Home / Self Care | Payer: Medicare Other | Source: Ambulatory Visit | Attending: Internal Medicine | Admitting: Internal Medicine

## 2017-05-28 DIAGNOSIS — M1711 Unilateral primary osteoarthritis, right knee: Secondary | ICD-10-CM | POA: Diagnosis not present

## 2017-05-28 DIAGNOSIS — Z1231 Encounter for screening mammogram for malignant neoplasm of breast: Secondary | ICD-10-CM

## 2017-06-04 ENCOUNTER — Other Ambulatory Visit: Payer: Self-pay | Admitting: Internal Medicine

## 2017-06-06 ENCOUNTER — Other Ambulatory Visit: Payer: Self-pay | Admitting: Internal Medicine

## 2017-06-27 ENCOUNTER — Other Ambulatory Visit (INDEPENDENT_AMBULATORY_CARE_PROVIDER_SITE_OTHER): Payer: Medicare Other

## 2017-06-27 ENCOUNTER — Other Ambulatory Visit: Payer: Self-pay | Admitting: Internal Medicine

## 2017-06-27 DIAGNOSIS — E7800 Pure hypercholesterolemia, unspecified: Secondary | ICD-10-CM | POA: Diagnosis not present

## 2017-06-27 DIAGNOSIS — I10 Essential (primary) hypertension: Secondary | ICD-10-CM | POA: Diagnosis not present

## 2017-06-27 LAB — LIPID PANEL
CHOL/HDL RATIO: 2
Cholesterol: 121 mg/dL (ref 0–200)
HDL: 49.6 mg/dL (ref >39.00)
LDL CALC: 53 mg/dL (ref 0–99)
NONHDL: 70.92
Triglycerides: 90 mg/dL (ref 0.0–149.0)
VLDL: 18 mg/dL (ref 0.0–40.0)

## 2017-06-27 LAB — HEPATIC FUNCTION PANEL
ALBUMIN: 4 g/dL (ref 3.5–5.2)
ALK PHOS: 83 U/L (ref 39–117)
ALT: 11 U/L (ref 0–35)
AST: 15 U/L (ref 0–37)
BILIRUBIN DIRECT: 0.1 mg/dL (ref 0.0–0.3)
Total Bilirubin: 0.6 mg/dL (ref 0.2–1.2)
Total Protein: 7.2 g/dL (ref 6.0–8.3)

## 2017-06-27 LAB — BASIC METABOLIC PANEL
BUN: 20 mg/dL (ref 6–23)
CALCIUM: 9.2 mg/dL (ref 8.4–10.5)
CO2: 29 mEq/L (ref 19–32)
Chloride: 104 mEq/L (ref 96–112)
Creatinine, Ser: 0.71 mg/dL (ref 0.40–1.20)
GFR: 86.65 mL/min (ref >60.00)
GLUCOSE: 106 mg/dL — AB (ref 70–99)
POTASSIUM: 4 meq/L (ref 3.5–5.1)
SODIUM: 141 meq/L (ref 135–145)

## 2017-06-27 LAB — TSH: TSH: 1.81 u[IU]/mL (ref 0.35–4.50)

## 2017-06-28 ENCOUNTER — Ambulatory Visit (INDEPENDENT_AMBULATORY_CARE_PROVIDER_SITE_OTHER): Payer: Medicare Other | Admitting: Internal Medicine

## 2017-06-28 ENCOUNTER — Encounter: Payer: Self-pay | Admitting: Internal Medicine

## 2017-06-28 VITALS — BP 118/60 | HR 80 | Temp 98.2°F | Ht 61.75 in | Wt 148.0 lb

## 2017-06-28 DIAGNOSIS — Z0000 Encounter for general adult medical examination without abnormal findings: Secondary | ICD-10-CM | POA: Diagnosis not present

## 2017-06-28 DIAGNOSIS — M1711 Unilateral primary osteoarthritis, right knee: Secondary | ICD-10-CM | POA: Diagnosis not present

## 2017-06-28 DIAGNOSIS — Z23 Encounter for immunization: Secondary | ICD-10-CM | POA: Diagnosis not present

## 2017-06-28 DIAGNOSIS — E7800 Pure hypercholesterolemia, unspecified: Secondary | ICD-10-CM | POA: Diagnosis not present

## 2017-06-28 DIAGNOSIS — I10 Essential (primary) hypertension: Secondary | ICD-10-CM

## 2017-06-28 DIAGNOSIS — R1030 Lower abdominal pain, unspecified: Secondary | ICD-10-CM | POA: Diagnosis not present

## 2017-06-28 DIAGNOSIS — K219 Gastro-esophageal reflux disease without esophagitis: Secondary | ICD-10-CM | POA: Diagnosis not present

## 2017-06-28 MED ORDER — ZOLPIDEM TARTRATE 10 MG PO TABS: 10.0000 mg | ORAL_TABLET | Freq: Every evening | ORAL | 1 refills | Status: DC | PRN

## 2017-06-28 MED ORDER — MUPIROCIN 2 % EX OINT: TOPICAL_OINTMENT | CUTANEOUS | 0 refills | Status: DC

## 2017-06-28 NOTE — Progress Notes (Signed)
Patient ID: Christine Mullins, female   DOB: June 07, 1946, 71 y.o.   MRN: [id number]   Subjective:    Patient ID: Christine Mullins, female    DOB: 1945/10/05, 71 y.o.   MRN: [id number]  HPI  Patient with past history of hypercholesterolemia, GERD and hypertension.  She comes in today to follow up on these issues as well as for a complete physical exam.  She reports she feels better.  S/p D&C, hysteroscopy and polyp removed.  No pain.  No bleeding.  Is s/p injection right knee.  Saw ortho.  Eating and drinking well. No nausea or vomiting.  No chest pain.  No sob.  No abdominal pain.  Handling stress.     Past Medical History:  Diagnosis Date  . Arthritis   . Depression   . GERD (gastroesophageal reflux disease)   . Hx of diverticulitis of colon   . Hyperlipidemia   . Hypertension    Past Surgical History:  Procedure Laterality Date  . BREAST BIOPSY Left 1995   negative  . CATARACT EXTRACTION, BILATERAL    . CESAREAN SECTION    . COLONOSCOPY WITH PROPOFOL N/A 05/15/2017   Procedure: COLONOSCOPY WITH PROPOFOL;  Surgeon: Lin Landsman, MD;  Location: [facility] ENDOSCOPY;  Service: Gastroenterology;  Laterality: N/A;  . FOOT SURGERY Bilateral   . GANGLION CYST EXCISION Left 1965   wrist  . GANGLION CYST EXCISION Right 1980   hand  . HYSTEROSCOPY W/D&C N/A 04/30/2017   Procedure: DILATATION AND CURETTAGE /HYSTEROSCOPY;  Surgeon: Defrancesco, Alanda Slim, MD;  Location: [facility] ORS;  Service: Gynecology;  Laterality: N/A;  . KNEE ARTHROSCOPY Right 2000   Family History  Problem Relation Age of Onset  . Arthritis Mother   . Hyperlipidemia Mother   . Hyperlipidemia Father   . Heart failure Father   . Breast cancer Neg Hx   . Ovarian cancer Neg Hx   . Colon cancer Neg Hx    Social History   Socioeconomic History  . Marital status: Single    Spouse name: None  . Number of children: None  . Years of education: None  . Highest education level: None  Social Needs  . Financial resource strain:  None  . Food insecurity - worry: None  . Food insecurity - inability: None  . Transportation needs - medical: None  . Transportation needs - non-medical: None  Occupational History  . None  Tobacco Use  . Smoking status: Never Smoker  . Smokeless tobacco: Never Used  Substance and Sexual Activity  . Alcohol use: Yes    Comment: social  . Drug use: No  . Sexual activity: Not Currently    Birth control/protection: Post-menopausal  Other Topics Concern  . None  Social History Narrative  . None    Outpatient Encounter Medications as of 06/28/2017  Medication Sig  . aspirin EC 81 MG tablet Take 81 mg by mouth daily.  . benazepril (LOTENSIN) 10 MG tablet TAKE 1 TABLET BY MOUTH DAILY  . escitalopram (LEXAPRO) 10 MG tablet TAKE 1 TABLET BY MOUTH DAILY  . hydrochlorothiazide (HYDRODIURIL) 25 MG tablet TAKE 1 TABLET BY MOUTH DAILY  . naproxen sodium (ALEVE) 220 MG tablet Take 220 mg by mouth 2 (two) times daily as needed.  Marland Kitchen omeprazole (PRILOSEC) 20 MG capsule Take 1 capsule (20 mg total) by mouth daily. (Patient taking differently: Take 40 mg by mouth daily. )  . Probiotic Product (PROBIOTIC PO) Take 1 tablet by mouth daily.  Marland Kitchen  simvastatin (ZOCOR) 40 MG tablet TAKE 1 TABLET BY MOUTH EVERY EVENING (Patient taking differently: Take 40 mg by mouth every evening)  . zolpidem (AMBIEN) 10 MG tablet Take 1 tablet (10 mg total) by mouth at bedtime as needed. for sleep  . [DISCONTINUED] zolpidem (AMBIEN) 10 MG tablet Take 1 tablet (10 mg total) by mouth at bedtime as needed. for sleep (Patient taking differently: Take 10 mg by mouth at bedtime as needed for sleep. )  . mupirocin ointment (BACTROBAN) 2 % Apply to affected area bid.  . [DISCONTINUED] ibuprofen (ADVIL,MOTRIN) 200 MG tablet Take 400 mg by mouth every 6 (six) hours as needed for headache or moderate pain.   No facility-administered encounter medications on file as of 06/28/2017.     Review of Systems  Constitutional: Negative for  appetite change and unexpected weight change.  HENT: Negative for congestion and sinus pressure.   Eyes: Negative for pain and visual disturbance.  Respiratory: Negative for cough, chest tightness and shortness of breath.   Cardiovascular: Negative for chest pain, palpitations and leg swelling.  Gastrointestinal: Negative for abdominal pain, diarrhea, nausea and vomiting.  Genitourinary: Negative for difficulty urinating and dysuria.  Musculoskeletal: Negative for back pain and joint swelling.  Skin: Negative for color change and rash.  Neurological: Negative for dizziness, light-headedness and headaches.  Hematological: Negative for adenopathy. Does not bruise/bleed easily.  Psychiatric/Behavioral: Negative for agitation and dysphoric mood.       Objective:    Physical Exam  Constitutional: She is oriented to person, place, and time. She appears well-developed and well-nourished. No distress.  HENT:  Nose: Nose normal.  Mouth/Throat: Oropharynx is clear and moist.  Eyes: Right eye exhibits no discharge. Left eye exhibits no discharge. No scleral icterus.  Neck: Neck supple. No thyromegaly present.  Cardiovascular: Normal rate and regular rhythm.  Pulmonary/Chest: Breath sounds normal. No accessory muscle usage. No tachypnea. No respiratory distress. She has no decreased breath sounds. She has no wheezes. She has no rhonchi. Right breast exhibits no inverted nipple, no mass, no nipple discharge and no tenderness (no axillary adenopathy). Left breast exhibits no inverted nipple, no mass, no nipple discharge and no tenderness (no axilarry adenopathy).  Abdominal: Soft. Bowel sounds are normal. There is no tenderness.  Musculoskeletal: She exhibits no edema or tenderness.  Lymphadenopathy:    She has no cervical adenopathy.  Neurological: She is alert and oriented to person, place, and time.  Skin: Skin is warm. No rash noted. No erythema.  Psychiatric: She has a normal mood and affect.  Her behavior is normal.    BP 118/60 (BP Location: Right Arm, Patient Position: Sitting, Cuff Size: Normal)   Pulse 80   Temp 98.2 F (36.8 C) (Oral)   Ht 5' 1.75" (1.568 m)   Wt 148 lb (67.1 kg)   SpO2 98%   BMI 27.29 kg/m  Wt Readings from Last 3 Encounters:  06/28/17 148 lb (67.1 kg)  05/15/17 144 lb (65.3 kg)  05/08/17 144 lb 12.8 oz (65.7 kg)     Lab Results  Component Value Date   WBC 7.4 04/25/2017   HGB 13.8 04/25/2017   HCT 39.9 04/25/2017   PLT 297 04/25/2017   GLUCOSE 106 (H) 06/27/2017   CHOL 121 06/27/2017   TRIG 90.0 06/27/2017   HDL 49.60 06/27/2017   LDLCALC 53 06/27/2017   ALT 11 06/27/2017   AST 15 06/27/2017   NA 141 06/27/2017   K 4.0 06/27/2017   CL 104 06/27/2017  CREATININE 0.71 06/27/2017   BUN 20 06/27/2017   CO2 29 06/27/2017   TSH 1.81 06/27/2017    Mm Digital Screening Bilateral  Result Date: 05/28/2017 CLINICAL DATA:  Screening. EXAM: DIGITAL SCREENING BILATERAL MAMMOGRAM WITH CAD COMPARISON:  Previous exam(s). ACR Breast Density Category c: The breast tissue is heterogeneously dense, which may obscure small masses. FINDINGS: There are no findings suspicious for malignancy. Images were processed with CAD. IMPRESSION: No mammographic evidence of malignancy. A result letter of this screening mammogram will be mailed directly to the patient. RECOMMENDATION: Screening mammogram in one year. (Code:SM-B-01Y) BI-RADS CATEGORY  1: Negative. Electronically Signed   By: Ammie Ferrier M.D.   On: 05/28/2017 14:11       Assessment & Plan:   Problem List Items Addressed This Visit    Essential hypertension    Blood pressure under good control.  Continue same medication regimen.  Follow pressures.  Follow metabolic panel.        Relevant Orders   Basic metabolic panel   GERD (gastroesophageal reflux disease)    Controlled on omeprazole.        Health care maintenance    Physical today 06/28/17.  PAP 03/23/14 - pap negative.  Mammogram  05/28/17- Birads I.  Colonoscopy 05/15/17 - two polyps (tubular adenoma).  Recommended f/u colonoscopy in 5 years.        Hypercholesterolemia    On simvastatin.  Low cholesterol diet and exercise.  Follow lipid panel and liver function tests.        Relevant Orders   Lipid panel   Hepatic function panel   Lower abdominal pain    S/p D&C, hysteroscopy and polyp removal.  Doing well.  No pain.  Follow.       Primary osteoarthritis of right knee    S/p injection.  Saw ortho.        Relevant Medications   naproxen sodium (ALEVE) 220 MG tablet    Other Visit Diagnoses    Need for pneumococcal vaccination    -  Primary   Relevant Orders   Pneumococcal conjugate vaccine 13-valent (Completed)       Einar Pheasant, MD

## 2017-06-28 NOTE — Assessment & Plan Note (Signed)
Physical today 06/28/17.  PAP 03/23/14 - pap negative.  Mammogram 05/28/17- Birads I.  Colonoscopy 05/15/17 - two polyps (tubular adenoma).  Recommended f/u colonoscopy in 5 years.

## 2017-07-01 ENCOUNTER — Encounter: Payer: Self-pay | Admitting: Internal Medicine

## 2017-07-01 NOTE — Assessment & Plan Note (Signed)
Controlled on omeprazole.   

## 2017-07-01 NOTE — Assessment & Plan Note (Signed)
On simvastatin.  Low cholesterol diet and exercise.  Follow lipid panel and liver function tests.   

## 2017-07-01 NOTE — Assessment & Plan Note (Signed)
S/p injection.  Saw ortho.

## 2017-07-01 NOTE — Assessment & Plan Note (Signed)
Blood pressure under good control.  Continue same medication regimen.  Follow pressures.  Follow metabolic panel.   

## 2017-07-01 NOTE — Assessment & Plan Note (Signed)
S/p D&C, hysteroscopy and polyp removal.  Doing well.  No pain.  Follow.

## 2017-07-05 ENCOUNTER — Encounter: Payer: Medicare Other | Admitting: Internal Medicine

## 2017-07-31 ENCOUNTER — Other Ambulatory Visit: Payer: Self-pay | Admitting: Internal Medicine

## 2017-09-05 DIAGNOSIS — M1812 Unilateral primary osteoarthritis of first carpometacarpal joint, left hand: Secondary | ICD-10-CM | POA: Diagnosis not present

## 2017-09-11 ENCOUNTER — Telehealth: Payer: Self-pay | Admitting: *Deleted

## 2017-09-11 MED ORDER — ESCITALOPRAM OXALATE 10 MG PO TABS: 10.0000 mg | ORAL_TABLET | Freq: Every day | ORAL | 1 refills | Status: DC

## 2017-09-11 NOTE — Telephone Encounter (Signed)
Pt now uses [facility] since [facility] closed. Needs new Rx sent for Lexapro. Rx was faxed three times.

## 2017-09-11 NOTE — Telephone Encounter (Signed)
rx ok'd for lexapro #30 with one refill.  Sent in to pharmacy.

## 2017-09-13 ENCOUNTER — Other Ambulatory Visit: Payer: Self-pay | Admitting: Internal Medicine

## 2017-09-24 ENCOUNTER — Other Ambulatory Visit (INDEPENDENT_AMBULATORY_CARE_PROVIDER_SITE_OTHER): Payer: Medicare Other

## 2017-09-24 DIAGNOSIS — E7800 Pure hypercholesterolemia, unspecified: Secondary | ICD-10-CM | POA: Diagnosis not present

## 2017-09-24 DIAGNOSIS — I10 Essential (primary) hypertension: Secondary | ICD-10-CM

## 2017-09-24 LAB — LIPID PANEL
Cholesterol: 158 mg/dL (ref 0–200)
HDL: 61.9 mg/dL (ref >39.00)
LDL Cholesterol: 82 mg/dL (ref 0–99)
NONHDL: 96.31
Total CHOL/HDL Ratio: 3
Triglycerides: 73 mg/dL (ref 0.0–149.0)
VLDL: 14.6 mg/dL (ref 0.0–40.0)

## 2017-09-24 LAB — BASIC METABOLIC PANEL
BUN: 16 mg/dL (ref 6–23)
CHLORIDE: 102 meq/L (ref 96–112)
CO2: 29 mEq/L (ref 19–32)
Calcium: 9.3 mg/dL (ref 8.4–10.5)
Creatinine, Ser: 0.77 mg/dL (ref 0.40–1.20)
GFR: 78.85 mL/min (ref >60.00)
GLUCOSE: 114 mg/dL — AB (ref 70–99)
Potassium: 3.5 mEq/L (ref 3.5–5.1)
SODIUM: 141 meq/L (ref 135–145)

## 2017-09-24 LAB — HEPATIC FUNCTION PANEL
ALBUMIN: 3.9 g/dL (ref 3.5–5.2)
ALT: 11 U/L (ref 0–35)
AST: 15 U/L (ref 0–37)
Alkaline Phosphatase: 80 U/L (ref 39–117)
Bilirubin, Direct: 0 mg/dL (ref 0.0–0.3)
Total Bilirubin: 0.4 mg/dL (ref 0.2–1.2)
Total Protein: 7.4 g/dL (ref 6.0–8.3)

## 2017-09-25 ENCOUNTER — Encounter: Payer: Self-pay | Admitting: Internal Medicine

## 2017-09-27 ENCOUNTER — Ambulatory Visit (INDEPENDENT_AMBULATORY_CARE_PROVIDER_SITE_OTHER): Payer: Medicare Other | Admitting: Internal Medicine

## 2017-09-27 ENCOUNTER — Other Ambulatory Visit: Payer: Self-pay | Admitting: Internal Medicine

## 2017-09-27 VITALS — BP 128/72 | HR 80 | Temp 97.6°F | Resp 18 | Ht 62.0 in | Wt 153.6 lb

## 2017-09-27 DIAGNOSIS — R300 Dysuria: Secondary | ICD-10-CM | POA: Diagnosis not present

## 2017-09-27 DIAGNOSIS — M79642 Pain in left hand: Secondary | ICD-10-CM | POA: Diagnosis not present

## 2017-09-27 DIAGNOSIS — I10 Essential (primary) hypertension: Secondary | ICD-10-CM

## 2017-09-27 DIAGNOSIS — E7800 Pure hypercholesterolemia, unspecified: Secondary | ICD-10-CM

## 2017-09-27 DIAGNOSIS — K219 Gastro-esophageal reflux disease without esophagitis: Secondary | ICD-10-CM | POA: Diagnosis not present

## 2017-09-27 DIAGNOSIS — R739 Hyperglycemia, unspecified: Secondary | ICD-10-CM

## 2017-09-27 LAB — POCT URINALYSIS DIPSTICK
Bilirubin, UA: NEGATIVE
GLUCOSE UA: NEGATIVE
Ketones, UA: NEGATIVE
Spec Grav, UA: >=1.03 — AB (ref 1.010–1.025)
pH, UA: 6 (ref 5.0–8.0)

## 2017-09-27 MED ORDER — CEPHALEXIN 500 MG PO CAPS: 500.0000 mg | ORAL_CAPSULE | Freq: Three times a day (TID) | ORAL | 0 refills | Status: DC

## 2017-09-27 MED ORDER — ZOLPIDEM TARTRATE 10 MG PO TABS: 10.0000 mg | ORAL_TABLET | Freq: Every evening | ORAL | 1 refills | Status: DC | PRN

## 2017-09-27 NOTE — Progress Notes (Signed)
u

## 2017-09-27 NOTE — Patient Instructions (Signed)
Take a probiotic daily while you are on the antibiotic and for two weeks after completing the antibiotic

## 2017-09-27 NOTE — Progress Notes (Signed)
Patient ID: Christine Mullins, female   DOB: September 18, 1945, 72 y.o.   MRN: [id number]   Subjective:    Patient ID: Christine Mullins, female    DOB: 11-20-1945, 72 y.o.   MRN: [id number]  HPI  Patient here for a scheduled follow up.  She reports she is doing relatively well.  She is concerned she may have uti.  Has noticed increased urinary frequency and urgency.  Nocturia.  Some discomfort with end urination.  No hematuria.  Breathing stable.  No chest pain.  No sob.  No acid reflux.  No abdominal pain.  Bowels moving.  Was having issues with her left hand.  S/p injection.  Better.  Was prescribed meloxicam.  Not taking. Discussed labs.  Discussed elevated blood sugar.  She wanted a glucometer to start checking her sugars at home.  Discussed diet and exercise.      Past Medical History:  Diagnosis Date  . Arthritis   . Depression   . GERD (gastroesophageal reflux disease)   . Hx of diverticulitis of colon   . Hyperlipidemia   . Hypertension    Past Surgical History:  Procedure Laterality Date  . BREAST BIOPSY Left 1995   negative  . CATARACT EXTRACTION, BILATERAL    . CESAREAN SECTION    . COLONOSCOPY WITH PROPOFOL N/A 05/15/2017   Procedure: COLONOSCOPY WITH PROPOFOL;  Surgeon: Lin Landsman, MD;  Location: [facility] ENDOSCOPY;  Service: Gastroenterology;  Laterality: N/A;  . FOOT SURGERY Bilateral   . GANGLION CYST EXCISION Left 1965   wrist  . GANGLION CYST EXCISION Right 1980   hand  . HYSTEROSCOPY W/D&C N/A 04/30/2017   Procedure: DILATATION AND CURETTAGE /HYSTEROSCOPY;  Surgeon: Defrancesco, Alanda Slim, MD;  Location: [facility] ORS;  Service: Gynecology;  Laterality: N/A;  . KNEE ARTHROSCOPY Right 2000   Family History  Problem Relation Age of Onset  . Arthritis Mother   . Hyperlipidemia Mother   . Hyperlipidemia Father   . Heart failure Father   . Breast cancer Neg Hx   . Ovarian cancer Neg Hx   . Colon cancer Neg Hx    Social History   Socioeconomic History  . Marital status:  Single    Spouse name: Not on file  . Number of children: Not on file  . Years of education: Not on file  . Highest education level: Not on file  Occupational History  . Not on file  Social Needs  . Financial resource strain: Not on file  . Food insecurity:    Worry: Not on file    Inability: Not on file  . Transportation needs:    Medical: Not on file    Non-medical: Not on file  Tobacco Use  . Smoking status: Never Smoker  . Smokeless tobacco: Never Used  Substance and Sexual Activity  . Alcohol use: Yes    Comment: social  . Drug use: No  . Sexual activity: Not Currently    Birth control/protection: Post-menopausal  Lifestyle  . Physical activity:    Days per week: Not on file    Minutes per session: Not on file  . Stress: Not on file  Relationships  . Social connections:    Talks on phone: Not on file    Gets together: Not on file    Attends religious service: Not on file    Active member of club or organization: Not on file    Attends meetings of clubs or organizations: Not on file  Relationship status: Not on file  Other Topics Concern  . Not on file  Social History Narrative  . Not on file    Outpatient Encounter Medications as of 09/27/2017  Medication Sig  . benazepril (LOTENSIN) 10 MG tablet TAKE 1 TABLET BY MOUTH DAILY  . escitalopram (LEXAPRO) 10 MG tablet Take 1 tablet (10 mg total) by mouth daily.  . hydrochlorothiazide (HYDRODIURIL) 25 MG tablet TAKE 1 TABLET BY MOUTH DAILY  . Probiotic Product (PROBIOTIC PO) Take 1 tablet by mouth daily.  . simvastatin (ZOCOR) 40 MG tablet TAKE 1 TABLET BY MOUTH EVERY EVENING (Patient taking differently: Take 40 mg by mouth every evening)  . zolpidem (AMBIEN) 10 MG tablet Take 1 tablet (10 mg total) by mouth at bedtime as needed. for sleep  . [DISCONTINUED] omeprazole (PRILOSEC) 20 MG capsule Take 1 capsule (20 mg total) by mouth daily. (Patient taking differently: Take 40 mg by mouth daily. )  . [DISCONTINUED]  zolpidem (AMBIEN) 10 MG tablet Take 1 tablet (10 mg total) by mouth at bedtime as needed. for sleep  . cephALEXin (KEFLEX) 500 MG capsule Take 1 capsule (500 mg total) by mouth 3 (three) times daily.  . meloxicam (MOBIC) 7.5 MG tablet   . mupirocin ointment (BACTROBAN) 2 % Apply to affected area bid. (Patient not taking: Reported on 09/27/2017)  . naproxen sodium (ALEVE) 220 MG tablet Take 220 mg by mouth 2 (two) times daily as needed.  Marland Kitchen omeprazole (PRILOSEC) 40 MG capsule   . [DISCONTINUED] aspirin EC 81 MG tablet Take 81 mg by mouth daily.   No facility-administered encounter medications on file as of 09/27/2017.     Review of Systems  Constitutional: Negative for appetite change and unexpected weight change.  HENT: Negative for congestion, sinus pressure and sneezing.   Respiratory: Negative for cough, chest tightness and shortness of breath.   Cardiovascular: Negative for chest pain and palpitations.  Gastrointestinal: Negative for abdominal pain, diarrhea, nausea and vomiting.  Genitourinary: Positive for dysuria and urgency. Negative for hematuria and vaginal discharge.  Musculoskeletal: Negative for joint swelling and myalgias.  Skin: Negative for color change and rash.  Neurological: Negative for dizziness, light-headedness and headaches.  Psychiatric/Behavioral: Negative for agitation and dysphoric mood.       Objective:     Blood pressure rechecked by me:  122/72  Physical Exam  Constitutional: She appears well-developed and well-nourished. No distress.  HENT:  Nose: Nose normal.  Mouth/Throat: Oropharynx is clear and moist.  Neck: Neck supple. No thyromegaly present.  Cardiovascular: Normal rate and regular rhythm.  Pulmonary/Chest: Breath sounds normal. No respiratory distress. She has no wheezes.  Abdominal: Soft. Bowel sounds are normal. There is no tenderness.  Musculoskeletal: She exhibits no edema or tenderness.  No CVA tenderness.    Lymphadenopathy:    She  has no cervical adenopathy.  Skin: No rash noted. No erythema.  Psychiatric: She has a normal mood and affect. Her behavior is normal.    BP 128/72 (BP Location: Left Arm, Patient Position: Sitting, Cuff Size: Normal)   Pulse 80   Temp 97.6 F (36.4 C) (Oral)   Resp 18   Ht '5\' 2"'$  (1.575 m)   Wt 153 lb 9.6 oz (69.7 kg)   SpO2 96%   BMI 28.09 kg/m  Wt Readings from Last 3 Encounters:  09/27/17 153 lb 9.6 oz (69.7 kg)  06/28/17 148 lb (67.1 kg)  05/15/17 144 lb (65.3 kg)     Lab Results  Component Value  Date   WBC 7.4 04/25/2017   HGB 13.8 04/25/2017   HCT 39.9 04/25/2017   PLT 297 04/25/2017   GLUCOSE 114 (H) 09/24/2017   CHOL 158 09/24/2017   TRIG 73.0 09/24/2017   HDL 61.90 09/24/2017   LDLCALC 82 09/24/2017   ALT 11 09/24/2017   AST 15 09/24/2017   NA 141 09/24/2017   K 3.5 09/24/2017   CL 102 09/24/2017   CREATININE 0.77 09/24/2017   BUN 16 09/24/2017   CO2 29 09/24/2017   TSH 1.81 06/27/2017    Mm Digital Screening Bilateral  Result Date: 05/28/2017 CLINICAL DATA:  Screening. EXAM: DIGITAL SCREENING BILATERAL MAMMOGRAM WITH CAD COMPARISON:  Previous exam(s). ACR Breast Density Category c: The breast tissue is heterogeneously dense, which may obscure small masses. FINDINGS: There are no findings suspicious for malignancy. Images were processed with CAD. IMPRESSION: No mammographic evidence of malignancy. A result letter of this screening mammogram will be mailed directly to the patient. RECOMMENDATION: Screening mammogram in one year. (Code:SM-B-01Y) BI-RADS CATEGORY  1: Negative. Electronically Signed   By: Ammie Ferrier M.D.   On: 05/28/2017 14:11       Assessment & Plan:   Problem List Items Addressed This Visit    Essential hypertension    Blood pressure under good control.  Continue same medication regimen.  Follow pressures.  Follow metabolic panel.        GERD (gastroesophageal reflux disease)    Controlled on omeprazole.        Relevant  Medications   omeprazole (PRILOSEC) 40 MG capsule   Hypercholesterolemia    Low cholesterol diet and exercise.  Follow lipid panel and liver function tests.  On simvastatin.        Hyperglycemia    Low carb diet and exercise.  Follow met b and a1c.  She was given a glucometer.  Instructed on how to check sugars.  Follow        Relevant Orders   Hemoglobin Y4M   Basic metabolic panel   Left hand pain    S/p injection.  Better.  Follow.        Other Visit Diagnoses    Dysuria    -  Primary   check urinalysis and culture to confirm no infection.     Relevant Orders   Urine Microscopic Only (Completed)   Urine Culture   POCT urinalysis dipstick (Completed)       Einar Pheasant, MD

## 2017-09-28 LAB — URINALYSIS, MICROSCOPIC ONLY

## 2017-09-30 ENCOUNTER — Encounter: Payer: Self-pay | Admitting: Internal Medicine

## 2017-09-30 DIAGNOSIS — R739 Hyperglycemia, unspecified: Secondary | ICD-10-CM | POA: Insufficient documentation

## 2017-09-30 DIAGNOSIS — M79642 Pain in left hand: Secondary | ICD-10-CM | POA: Insufficient documentation

## 2017-09-30 NOTE — Assessment & Plan Note (Signed)
S/p injection.  Better.  Follow.

## 2017-09-30 NOTE — Assessment & Plan Note (Signed)
Low carb diet and exercise.  Follow met b and a1c.  She was given a glucometer.  Instructed on how to check sugars.  Follow

## 2017-09-30 NOTE — Assessment & Plan Note (Signed)
Blood pressure under good control.  Continue same medication regimen.  Follow pressures.  Follow metabolic panel.   

## 2017-09-30 NOTE — Assessment & Plan Note (Signed)
Low cholesterol diet and exercise.  Follow lipid panel and liver function tests.  On simvastatin.

## 2017-09-30 NOTE — Assessment & Plan Note (Signed)
Controlled on omeprazole.   

## 2017-10-02 LAB — URINE CULTURE

## 2017-10-03 ENCOUNTER — Other Ambulatory Visit: Payer: Self-pay | Admitting: Internal Medicine

## 2017-10-03 ENCOUNTER — Telehealth: Payer: Self-pay | Admitting: Radiology

## 2017-10-03 NOTE — Addendum Note (Signed)
Addended by: Arby Barrette on: 10/03/2017 08:55 AM   Modules accepted: Orders

## 2017-10-03 NOTE — Telephone Encounter (Signed)
Called [facility] to check on pt urine culture. [facility] representative stated urine culture was never received. Would you like pt to come back in to provide culture?

## 2017-10-03 NOTE — Telephone Encounter (Signed)
Patient called back stating she has improved greatly and she still has 2 days left of medicine. Please cal her back RE urine sample.

## 2017-10-03 NOTE — Telephone Encounter (Signed)
Left message to call back  

## 2017-10-03 NOTE — Telephone Encounter (Signed)
Please notify pt that urine culture was not sent  She was treated for uti.  Have her symptoms resolved/improved.  If no, let me know.  If resolved, have her return and leave urine to confirm clearance.

## 2017-10-03 NOTE — Telephone Encounter (Signed)
Returned patients call she stated she is feeling better. Per Dr. Nicki Reaper phone encounter since a culture was not sent out she would like for patient to come in and leave urine sample to see if clear. Patient states that she will come in tomorrow to leave sample.

## 2017-10-04 ENCOUNTER — Other Ambulatory Visit: Payer: Medicare Other

## 2017-10-04 DIAGNOSIS — R300 Dysuria: Secondary | ICD-10-CM

## 2017-10-05 LAB — URINE CULTURE
MICRO NUMBER:: 90388163
Result:: NO GROWTH
SPECIMEN QUALITY: ADEQUATE

## 2017-10-08 ENCOUNTER — Telehealth: Payer: Self-pay | Admitting: Internal Medicine

## 2017-10-08 NOTE — Telephone Encounter (Signed)
Copied from [place] [id number]. Topic: Quick Communication - Lab Results >> Oct 08, 2017  5:04 PM Lars Masson, LPN wrote: Called patient to inform them of lab results. When patient returns call, triage nurse may disclose results. >> Oct 08, 2017  5:09 PM Marin Olp L wrote: Returning call for lab results. No PED nurse availlable. Please call back.

## 2017-10-09 NOTE — Telephone Encounter (Signed)
Patient called and given results, charted in result notes.

## 2017-11-01 ENCOUNTER — Other Ambulatory Visit: Payer: Self-pay | Admitting: Internal Medicine

## 2017-11-01 ENCOUNTER — Other Ambulatory Visit: Payer: Medicare Other

## 2017-11-02 NOTE — Telephone Encounter (Signed)
rx ok'd for ambien #30 with one refill.

## 2017-11-02 NOTE — Telephone Encounter (Signed)
Refilled: 09/27/2017 Last OV: 09/27/2017 Next OV: 01/28/2018

## 2017-11-13 ENCOUNTER — Other Ambulatory Visit: Payer: Medicare Other

## 2017-11-23 ENCOUNTER — Other Ambulatory Visit (INDEPENDENT_AMBULATORY_CARE_PROVIDER_SITE_OTHER): Payer: Medicare Other

## 2017-11-23 DIAGNOSIS — R739 Hyperglycemia, unspecified: Secondary | ICD-10-CM | POA: Diagnosis not present

## 2017-11-23 LAB — BASIC METABOLIC PANEL
BUN: 19 mg/dL (ref 6–23)
CO2: 31 meq/L (ref 19–32)
Calcium: 9.1 mg/dL (ref 8.4–10.5)
Chloride: 100 mEq/L (ref 96–112)
Creatinine, Ser: 0.79 mg/dL (ref 0.40–1.20)
GFR: 76.51 mL/min (ref >60.00)
GLUCOSE: 89 mg/dL (ref 70–99)
POTASSIUM: 3.4 meq/L — AB (ref 3.5–5.1)
SODIUM: 140 meq/L (ref 135–145)

## 2017-11-23 LAB — HEMOGLOBIN A1C: Hgb A1c MFr Bld: 6.1 % (ref 4.6–6.5)

## 2017-11-26 ENCOUNTER — Other Ambulatory Visit: Payer: Self-pay | Admitting: Internal Medicine

## 2017-11-26 DIAGNOSIS — E876 Hypokalemia: Secondary | ICD-10-CM

## 2017-11-26 NOTE — Progress Notes (Signed)
Order placed for f/u potassium.  

## 2017-12-05 ENCOUNTER — Other Ambulatory Visit: Payer: Self-pay | Admitting: Internal Medicine

## 2017-12-07 ENCOUNTER — Other Ambulatory Visit (INDEPENDENT_AMBULATORY_CARE_PROVIDER_SITE_OTHER): Payer: Medicare Other

## 2017-12-07 DIAGNOSIS — E876 Hypokalemia: Secondary | ICD-10-CM

## 2017-12-07 LAB — POTASSIUM: Potassium: 3.4 mEq/L — ABNORMAL LOW (ref 3.5–5.1)

## 2017-12-09 ENCOUNTER — Other Ambulatory Visit: Payer: Self-pay | Admitting: Internal Medicine

## 2017-12-09 DIAGNOSIS — E876 Hypokalemia: Secondary | ICD-10-CM

## 2017-12-09 NOTE — Progress Notes (Signed)
Order placed for f/u potassium.  

## 2017-12-10 ENCOUNTER — Other Ambulatory Visit: Payer: Self-pay

## 2017-12-10 MED ORDER — POTASSIUM CHLORIDE ER 10 MEQ PO TBCR: 10.0000 meq | EXTENDED_RELEASE_TABLET | Freq: Every day | ORAL | 0 refills | Status: DC

## 2017-12-14 DIAGNOSIS — M5442 Lumbago with sciatica, left side: Secondary | ICD-10-CM | POA: Diagnosis not present

## 2017-12-18 ENCOUNTER — Other Ambulatory Visit (INDEPENDENT_AMBULATORY_CARE_PROVIDER_SITE_OTHER): Payer: Medicare Other

## 2017-12-18 DIAGNOSIS — E876 Hypokalemia: Secondary | ICD-10-CM

## 2017-12-18 LAB — POTASSIUM: Potassium: 3.5 mEq/L (ref 3.5–5.1)

## 2017-12-19 ENCOUNTER — Other Ambulatory Visit: Payer: Self-pay | Admitting: Internal Medicine

## 2017-12-19 DIAGNOSIS — E876 Hypokalemia: Secondary | ICD-10-CM

## 2017-12-19 NOTE — Progress Notes (Signed)
Order placed for f/u potassium.  

## 2018-01-01 ENCOUNTER — Other Ambulatory Visit: Payer: Medicare Other

## 2018-01-02 ENCOUNTER — Other Ambulatory Visit: Payer: Self-pay | Admitting: Internal Medicine

## 2018-01-02 ENCOUNTER — Other Ambulatory Visit (INDEPENDENT_AMBULATORY_CARE_PROVIDER_SITE_OTHER): Payer: Medicare Other

## 2018-01-02 DIAGNOSIS — E876 Hypokalemia: Secondary | ICD-10-CM | POA: Diagnosis not present

## 2018-01-02 NOTE — Addendum Note (Signed)
Addended by: Arby Barrette on: 01/02/2018 03:26 PM   Modules accepted: Orders

## 2018-01-02 NOTE — Telephone Encounter (Signed)
Pt is requesting to have her zolpidem (AMBIEN) 10 MG tablet to be refilled.

## 2018-01-03 ENCOUNTER — Encounter: Payer: Self-pay | Admitting: Internal Medicine

## 2018-01-03 LAB — POTASSIUM: POTASSIUM: 3.5 mmol/L (ref 3.5–5.3)

## 2018-01-03 NOTE — Telephone Encounter (Signed)
Last OV 09/27/2017 Next OV 01/28/2018 Last refill 11/02/2017

## 2018-01-04 NOTE — Telephone Encounter (Signed)
If she is taking 10mg  1/2 tablet then she should not need new rx at this time.  Per history review, received rx for ambien 10mg  #30 with one refill on 10/09/17.  Should last 4 months.  Can clarify with pharmacy.

## 2018-01-04 NOTE — Telephone Encounter (Signed)
It appears she is taking the full 10mg  tablet before bed.  Please confirm with pt.  It is recommended to try and not exceed 5mg  daily.  I would like for her to try and reduce the dose to 5mg  before bed.  See if willing to do this.

## 2018-01-04 NOTE — Telephone Encounter (Signed)
Patient says she is not taking 10 mg. She breaks them in half and would like to continue the 10 mg tablets

## 2018-01-07 ENCOUNTER — Other Ambulatory Visit: Payer: Self-pay | Admitting: Internal Medicine

## 2018-01-28 ENCOUNTER — Encounter: Payer: Self-pay | Admitting: Internal Medicine

## 2018-01-28 ENCOUNTER — Ambulatory Visit (INDEPENDENT_AMBULATORY_CARE_PROVIDER_SITE_OTHER): Payer: Medicare Other | Admitting: Internal Medicine

## 2018-01-28 ENCOUNTER — Ambulatory Visit (INDEPENDENT_AMBULATORY_CARE_PROVIDER_SITE_OTHER): Payer: Medicare Other

## 2018-01-28 VITALS — BP 128/72 | HR 72 | Temp 97.7°F | Resp 18 | Wt 154.0 lb

## 2018-01-28 DIAGNOSIS — I10 Essential (primary) hypertension: Secondary | ICD-10-CM

## 2018-01-28 DIAGNOSIS — K219 Gastro-esophageal reflux disease without esophagitis: Secondary | ICD-10-CM

## 2018-01-28 DIAGNOSIS — E7800 Pure hypercholesterolemia, unspecified: Secondary | ICD-10-CM | POA: Diagnosis not present

## 2018-01-28 DIAGNOSIS — R059 Cough, unspecified: Secondary | ICD-10-CM

## 2018-01-28 DIAGNOSIS — R739 Hyperglycemia, unspecified: Secondary | ICD-10-CM

## 2018-01-28 DIAGNOSIS — R05 Cough: Secondary | ICD-10-CM

## 2018-01-28 DIAGNOSIS — M810 Age-related osteoporosis without current pathological fracture: Secondary | ICD-10-CM

## 2018-01-28 MED ORDER — PREDNISONE 10 MG PO TABS: ORAL_TABLET | ORAL | 0 refills | Status: DC

## 2018-01-28 NOTE — Progress Notes (Signed)
Patient ID: Christine Mullins, female   DOB: 05/07/1946, 72 y.o.   MRN: [id number]   Subjective:    Patient ID: Christine Mullins, female    DOB: 02/22/1946, 72 y.o.   MRN: [id number]  HPI  Patient here for a scheduled follow up.  Was evaluated by ortho 12/14/17 for low back pain with left sided sciatica.  Was placed on prednisone.  Better.  Has noticed over the last month a persistent cough and increased congestion.  No fever.  Some wheezing.  No acid reflux.  Persistent.  No sinus pressure or nasal congestion.  No drainage.  Eating and drinking well.  Does not feel bad, just persistent congestion and cough.  Breathing stable. No nausea or vomiting.  No bowel change.  Some coughing fits at times.     Past Medical History:  Diagnosis Date  . Arthritis   . Depression   . GERD (gastroesophageal reflux disease)   . Hx of diverticulitis of colon   . Hyperlipidemia   . Hypertension    Past Surgical History:  Procedure Laterality Date  . BREAST BIOPSY Left 1995   negative  . CATARACT EXTRACTION, BILATERAL    . CESAREAN SECTION    . COLONOSCOPY WITH PROPOFOL N/A 05/15/2017   Procedure: COLONOSCOPY WITH PROPOFOL;  Surgeon: Vanga, Rohini Reddy, MD;  Location: [facility] ENDOSCOPY;  Service: Gastroenterology;  Laterality: N/A;  . FOOT SURGERY Bilateral   . GANGLION CYST EXCISION Left 1965   wrist  . GANGLION CYST EXCISION Right 1980   hand  . HYSTEROSCOPY W/D&C N/A 04/30/2017   Procedure: DILATATION AND CURETTAGE /HYSTEROSCOPY;  Surgeon: Defrancesco, Martin A, MD;  Location: [facility] ORS;  Service: Gynecology;  Laterality: N/A;  . KNEE ARTHROSCOPY Right 2000   Family History  Problem Relation Age of Onset  . Arthritis Mother   . Hyperlipidemia Mother   . Hyperlipidemia Father   . Heart failure Father   . Breast cancer Neg Hx   . Ovarian cancer Neg Hx   . Colon cancer Neg Hx    Social History   Socioeconomic History  . Marital status: Single    Spouse name: Not on file  . Number of children: Not on  file  . Years of education: Not on file  . Highest education level: Not on file  Occupational History  . Not on file  Social Needs  . Financial resource strain: Not on file  . Food insecurity:    Worry: Not on file    Inability: Not on file  . Transportation needs:    Medical: Not on file    Non-medical: Not on file  Tobacco Use  . Smoking status: Never Smoker  . Smokeless tobacco: Never Used  Substance and Sexual Activity  . Alcohol use: Yes    Comment: social  . Drug use: No  . Sexual activity: Not Currently    Birth control/protection: Post-menopausal  Lifestyle  . Physical activity:    Days per week: Not on file    Minutes per session: Not on file  . Stress: Not on file  Relationships  . Social connections:    Talks on phone: Not on file    Gets together: Not on file    Attends religious service: Not on file    Active member of club or organization: Not on file    Attends meetings of clubs or organizations: Not on file    Relationship status: Not on file  Other Topics Concern  . Not   on file  Social History Narrative  . Not on file    Outpatient Encounter Medications as of 01/28/2018  Medication Sig  . benazepril (LOTENSIN) 10 MG tablet TAKE 1 TABLET BY MOUTH DAILY  . escitalopram (LEXAPRO) 10 MG tablet TAKE 1 TABLET BY MOUTH DAILY  . hydrochlorothiazide (HYDRODIURIL) 25 MG tablet TAKE 1 TABLET BY MOUTH DAILY  . meloxicam (MOBIC) 7.5 MG tablet   . mupirocin ointment (BACTROBAN) 2 % Apply to affected area bid. (Patient not taking: Reported on 09/27/2017)  . naproxen sodium (ALEVE) 220 MG tablet Take 220 mg by mouth 2 (two) times daily as needed.  . omeprazole (PRILOSEC) 40 MG capsule   . potassium chloride (K-DUR) 10 MEQ tablet Take 1 tablet (10 mEq total) by mouth daily.  . predniSONE (DELTASONE) 10 MG tablet Take 4 tablets x 1 day and then decrease by 1/2 tablet per day until down to zero mg.  . Probiotic Product (PROBIOTIC PO) Take 1 tablet by mouth daily.  .  simvastatin (ZOCOR) 40 MG tablet TAKE 1 TABLET BY MOUTH EVERY EVENING  . zolpidem (AMBIEN) 10 MG tablet TAKE 1 TABLET BY MOUTH AT BEDTIME AS NEEDED FOR SLEEP  . [DISCONTINUED] cephALEXin (KEFLEX) 500 MG capsule Take 1 capsule (500 mg total) by mouth 3 (three) times daily.   No facility-administered encounter medications on file as of 01/28/2018.     Review of Systems  Constitutional: Negative for appetite change and fever.  HENT: Negative for congestion, postnasal drip and sinus pressure.   Respiratory: Positive for cough. Negative for chest tightness and shortness of breath.        Increased congestion.    Cardiovascular: Negative for chest pain, palpitations and leg swelling.  Gastrointestinal: Negative for abdominal pain, diarrhea, nausea and vomiting.  Musculoskeletal: Negative for joint swelling and myalgias.  Skin: Negative for color change and rash.  Neurological: Negative for dizziness and headaches.  Psychiatric/Behavioral: Negative for agitation and dysphoric mood.       Objective:    Physical Exam  Constitutional: She appears well-developed and well-nourished. No distress.  HENT:  Nose: Nose normal.  Mouth/Throat: Oropharynx is clear and moist.  Neck: Neck supple. No thyromegaly present.  Cardiovascular: Normal rate and regular rhythm.  Pulmonary/Chest: No respiratory distress.  Increased crackles - more right lower lobe.  No wheezing.  Some increased cough with forced expiration.    Abdominal: Soft. Bowel sounds are normal. There is no tenderness.  Musculoskeletal: She exhibits no edema or tenderness.  Lymphadenopathy:    She has no cervical adenopathy.  Skin: No rash noted. No erythema.  Psychiatric: She has a normal mood and affect. Her behavior is normal.    BP 128/72 (BP Location: Left Arm, Patient Position: Sitting, Cuff Size: Normal)   Pulse 72   Temp 97.7 F (36.5 C) (Oral)   Resp 18   Wt 154 lb (69.9 kg)   SpO2 97%   BMI 28.17 kg/m  Wt Readings from  Last 3 Encounters:  01/28/18 154 lb (69.9 kg)  09/27/17 153 lb 9.6 oz (69.7 kg)  06/28/17 148 lb (67.1 kg)     Lab Results  Component Value Date   WBC 7.4 04/25/2017   HGB 13.8 04/25/2017   HCT 39.9 04/25/2017   PLT 297 04/25/2017   GLUCOSE 89 11/23/2017   CHOL 158 09/24/2017   TRIG 73.0 09/24/2017   HDL 61.90 09/24/2017   LDLCALC 82 09/24/2017   ALT 11 09/24/2017   AST 15 09/24/2017   NA   140 11/23/2017   K 3.5 01/02/2018   CL 100 11/23/2017   CREATININE 0.79 11/23/2017   BUN 19 11/23/2017   CO2 31 11/23/2017   TSH 1.81 06/27/2017   HGBA1C 6.1 11/23/2017    Mm Digital Screening Bilateral  Result Date: 05/28/2017 CLINICAL DATA:  Screening. EXAM: DIGITAL SCREENING BILATERAL MAMMOGRAM WITH CAD COMPARISON:  Previous exam(s). ACR Breast Density Category c: The breast tissue is heterogeneously dense, which may obscure small masses. FINDINGS: There are no findings suspicious for malignancy. Images were processed with CAD. IMPRESSION: No mammographic evidence of malignancy. A result letter of this screening mammogram will be mailed directly to the patient. RECOMMENDATION: Screening mammogram in one year. (Code:SM-B-01Y) BI-RADS CATEGORY  1: Negative. Electronically Signed   By: Ammie Ferrier M.D.   On: 05/28/2017 14:11       Assessment & Plan:   Problem List Items Addressed This Visit    Cough - Primary    Persistent with increased congestion.  No fever.  Does not appear to feel bad.  Treat with prednisone taper as directed.  Abnormal lung exam as outlined.  Check cxr.  Acid reflux controlled.  With the increased congestion, do not feel related to the benazepril.  May need pulmonary input.        Relevant Orders   DG Chest 2 View (Completed)   Essential hypertension    Blood pressure under good control.  Continue same medication regimen.  Follow pressures.  Follow metabolic panel.        Relevant Orders   CBC with Differential/Platelet   Basic metabolic panel   GERD  (gastroesophageal reflux disease)    No acid reflux.  Controlled on omeprazole.        Hypercholesterolemia    On simvastatin.  Low cholesterol diet and exercise.  Follow lipid panel and liver function tests.        Relevant Orders   Lipid panel   Hepatic function panel   Hyperglycemia    Low carb diet and exercise.  Follow met b and a1c.        Relevant Orders   Hemoglobin A1c   Osteoporosis    Check vitamin D level.        Relevant Orders   VITAMIN D 25 Hydroxy (Vit-D Deficiency, Fractures)       Einar Pheasant, MD

## 2018-01-29 ENCOUNTER — Encounter: Payer: Self-pay | Admitting: Internal Medicine

## 2018-01-29 DIAGNOSIS — R05 Cough: Secondary | ICD-10-CM | POA: Insufficient documentation

## 2018-01-29 DIAGNOSIS — R059 Cough, unspecified: Secondary | ICD-10-CM | POA: Insufficient documentation

## 2018-01-29 NOTE — Assessment & Plan Note (Signed)
On simvastatin.  Low cholesterol diet and exercise.  Follow lipid panel and liver function tests.   

## 2018-01-29 NOTE — Assessment & Plan Note (Signed)
Blood pressure under good control.  Continue same medication regimen.  Follow pressures.  Follow metabolic panel.   

## 2018-01-29 NOTE — Assessment & Plan Note (Signed)
Persistent with increased congestion.  No fever.  Does not appear to feel bad.  Treat with prednisone taper as directed.  Abnormal lung exam as outlined.  Check cxr.  Acid reflux controlled.  With the increased congestion, do not feel related to the benazepril.  May need pulmonary input.

## 2018-01-29 NOTE — Assessment & Plan Note (Signed)
Low carb diet and exercise.  Follow met b and a1c.

## 2018-01-29 NOTE — Assessment & Plan Note (Signed)
No acid reflux.  Controlled on omeprazole.

## 2018-01-29 NOTE — Assessment & Plan Note (Signed)
Check vitamin D level 

## 2018-01-30 ENCOUNTER — Other Ambulatory Visit: Payer: Self-pay | Admitting: Internal Medicine

## 2018-01-30 DIAGNOSIS — R9389 Abnormal findings on diagnostic imaging of other specified body structures: Secondary | ICD-10-CM

## 2018-01-30 DIAGNOSIS — R05 Cough: Secondary | ICD-10-CM

## 2018-01-30 DIAGNOSIS — R059 Cough, unspecified: Secondary | ICD-10-CM

## 2018-01-30 NOTE — Progress Notes (Signed)
Order placed for referral to pulmonary.

## 2018-02-08 ENCOUNTER — Other Ambulatory Visit: Payer: Self-pay | Admitting: Internal Medicine

## 2018-02-08 ENCOUNTER — Ambulatory Visit (INDEPENDENT_AMBULATORY_CARE_PROVIDER_SITE_OTHER): Payer: Medicare Other | Admitting: Internal Medicine

## 2018-02-08 ENCOUNTER — Encounter: Payer: Self-pay | Admitting: Internal Medicine

## 2018-02-08 VITALS — BP 104/60 | HR 89 | Resp 16 | Ht 62.0 in | Wt 149.0 lb

## 2018-02-08 DIAGNOSIS — R059 Cough, unspecified: Secondary | ICD-10-CM

## 2018-02-08 DIAGNOSIS — R05 Cough: Secondary | ICD-10-CM | POA: Diagnosis not present

## 2018-02-08 DIAGNOSIS — J209 Acute bronchitis, unspecified: Secondary | ICD-10-CM

## 2018-02-08 MED ORDER — ALBUTEROL SULFATE HFA 108 (90 BASE) MCG/ACT IN AERS
2.0000 | INHALATION_SPRAY | RESPIRATORY_TRACT | 2 refills | Status: DC | PRN
Start: 2018-02-08 — End: 2018-11-22

## 2018-02-08 MED ORDER — AZITHROMYCIN 250 MG PO TABS: ORAL_TABLET | ORAL | 0 refills | Status: DC

## 2018-02-08 NOTE — Patient Instructions (Signed)
Start Z pak Start ALBUTEROL 2-4 puffs every 4 hrs as needed

## 2018-02-08 NOTE — Progress Notes (Signed)
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[employer] and feels like she was exposed to sick contacts with kids Patient developed a sore throat with headaches along with sneezing coughing upper respiratory tract infection and symptoms  It lasted for approximately 7 days but her cough continued to progress also had green and yellow mucus production Patient also had intermittent wheezing with increased shortness of breath and dyspnea on exertion  Patient had fatigue and lethargy throughout the day  Patient saw her private PCP and was given prednisone taper She finished her taper approximately 2 days ago she felt a whole lot better with the prednisone Her wheezing decreased her energy increased She was not given antibiotics Patient is a non-smoker nonalcoholic No secondhand smoke exposure Patient works as a Pharmacist, 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  has a past surgical history that includes Foot surgery (Bilateral); Cesarean section; Breast biopsy (Left, 1995); Knee arthroscopy (Right, 2000); Cataract extraction, bilateral; Ganglion cyst excision (Left, 1965); Ganglion cyst excision (Right, 1980); Hysteroscopy w/D&C (N/A, 04/30/2017); and Colonoscopy with propofol (N/A, 05/15/2017). Prior to Admission medications     Medication Sig Start Date End Date Taking? Authorizing Provider  benazepril (LOTENSIN) 10 MG tablet TAKE 1 TABLET BY MOUTH DAILY 09/14/17   Einar Pheasant, MD  escitalopram (LEXAPRO) 10 MG tablet TAKE 1 TABLET BY MOUTH DAILY 12/05/17   Einar Pheasant, MD  hydrochlorothiazide (HYDRODIURIL) 25 MG tablet TAKE 1 TABLET BY MOUTH DAILY 12/05/17   Einar Pheasant, MD  meloxicam [facility]) 7.5 MG tablet  09/05/17   [provider]  mupirocin ointment (BACTROBAN) 2 % Apply to affected area bid. Patient not taking: Reported on 09/27/2017 06/28/17   Einar Pheasant, MD  naproxen sodium (ALEVE) 220 MG tablet Take 220 mg by mouth 2 (two) times daily as needed.    [provider]  omeprazole (PRILOSEC) 40 MG capsule  07/12/17   [provider]  potassium chloride (K-DUR) 10 MEQ tablet Take 1 tablet (10 mEq total) by mouth daily. 12/10/17   Einar Pheasant, MD  predniSONE (DELTASONE) 10 MG tablet Take 4 tablets x 1 day and then decrease by 1/2 tablet per day until down to zero mg. 01/28/18   Einar Pheasant, MD  Probiotic Product (PROBIOTIC PO) Take 1 tablet by mouth daily.    [provider]  simvastatin (ZOCOR) 40 MG tablet TAKE 1 TABLET BY MOUTH EVERY EVENING 01/07/18   Einar Pheasant, MD  zolpidem (AMBIEN) 10 MG tablet TAKE 1 TABLET BY MOUTH AT BEDTIME AS NEEDED FOR SLEEP 11/02/17   Einar Pheasant, MD   No Known Allergies  FAMILY HISTORY:  family history includes Arthritis in her mother; Heart failure in her father; Hyperlipidemia in her father and mother. SOCIAL HISTORY:  reports that she has never smoked. She has never used smokeless tobacco. She reports that she drinks alcohol. She reports that  she does not use drugs.  REVIEW OF SYSTEMS:   Constitutional: Negative for fever, chills, weight loss, malaise/fatigue and diaphoresis.  HENT: Negative for hearing loss, ear pain, nosebleeds, congestion, sore throat, neck pain, tinnitus and ear discharge.   Eyes: Negative for blurred  vision, double vision, photophobia, pain, discharge and redness.  Respiratory: + Cough,-hemoptysis, + sputum production, + shortness of breath, + wheezing and-stridor.   Cardiovascular: Negative for chest pain, palpitations, orthopnea, claudication, leg swelling and PND.  Gastrointestinal: Negative for heartburn, nausea, vomiting, abdominal pain, diarrhea, constipation, blood in stool and melena.  Genitourinary: Negative for dysuria, urgency, frequency, hematuria and flank pain.  Musculoskeletal: Negative for myalgias, back pain, joint pain and falls.  Skin: Negative for itching and rash.  Neurological: Negative for dizziness, tingling, tremors, sensory change, speech change, focal weakness, seizures, loss of consciousness, weakness and headaches.  Endo/Heme/Allergies: Negative for environmental allergies and polydipsia. Does not bruise/bleed easily.  ALL OTHER ROS ARE NEGATIVE  BP 104/60 (BP Location: Left Arm, Cuff Size: Normal)   Pulse 89   Resp 16   Ht 5\' 2"  (1.575 m)   Wt 149 lb (67.6 kg)   SpO2 96%   BMI 27.25 kg/m  Think he can qualify for a POC for so after retesting and I would like so he has a stable that on the POC Sudini 5 L I completed I have to put order for 6-minute Physical Examination:   GENERAL:NAD, no fevers, chills, no weakness no fatigue HEAD: Normocephalic, atraumatic.  EYES: Pupils equal, round, reactive to light. Extraocular muscles intact. No scleral icterus.  MOUTH: Moist mucosal membrane.   EAR, NOSE, THROAT: Clear without exudates. No external lesions.  NECK: Supple. No thyromegaly. No nodules. No JVD.  PULMONARY:CTA B/L + wheezes, no crackles, no rhonchi CARDIOVASCULAR: S1 and S2. Regular rate and rhythm. No murmurs, rubs, or gallops. No edema.  GASTROINTESTINAL: Soft, nontender, nondistended. No masses. Positive bowel sounds.  MUSCULOSKELETAL: No swelling, clubbing, or edema. Range of motion full in all extremities.  NEUROLOGIC: Cranial nerves II through  XII are intact. No gross focal neurological deficits.  SKIN: No ulceration, lesions, rashes, or cyanosis. Skin warm and dry. Turgor intact.  PSYCHIATRIC: Mood, affect within normal limits. The patient is awake, alert and oriented x 3. Insight, judgment intact.      ASSESSMENT / PLAN: 72 year old pleasant white female seen today for chronic cough with mucus production most likely related to acute bronchitis with reactive airways disease  At this time I would recommend starting Z-Pak to cover atypical pneumonia I would also recommend starting albuterol inhaler 2 to 4 puffs every 4 hours as needed  Recommend following up in 1 month for interval assessment Patient continues to have symptoms recommend inhaled steroid or long-acting beta agonist Will also consider CT of the chest to assess her reticulonodular disease   Patient satisfied with Plan of action and management. All questions answered  Corrin Parker, M.D.  [facility]  Medical Director [place] Director [facility] Cardio-Pulmonary Department

## 2018-02-13 ENCOUNTER — Telehealth: Payer: Self-pay | Admitting: Internal Medicine

## 2018-02-13 NOTE — Telephone Encounter (Signed)
Consult on 02/08/18 for cough:     Return in about 1 month (around 03/08/2018).   Start Z pak Start ALBUTEROL 2-4 puffs every 4 hrs as needed         Patient states Albuterol has not helped.  PLEASE ADVISE.

## 2018-02-13 NOTE — Telephone Encounter (Signed)
Start ADVAIR [facility] 115 use as directed

## 2018-02-13 NOTE — Telephone Encounter (Signed)
Patient calling States that the albuterol has not been working  Will need something stronger Please call to discuss

## 2018-02-14 MED ORDER — FLUTICASONE-SALMETEROL 115-21 MCG/ACT IN AERO: 2.0000 | INHALATION_SPRAY | Freq: Two times a day (BID) | RESPIRATORY_TRACT | 2 refills | Status: DC

## 2018-02-14 NOTE — Telephone Encounter (Signed)
Inhaler sent to patient's pharmacy. Patient notified new inhaler Advair Hfa 115 sent and she was directed to take 2 puffs qam and 2 puffs qpm. Pt also made aware that she needs to rinse mouth after each use. Nothing further needed.

## 2018-03-07 ENCOUNTER — Other Ambulatory Visit: Payer: Self-pay | Admitting: Internal Medicine

## 2018-03-14 DIAGNOSIS — L821 Other seborrheic keratosis: Secondary | ICD-10-CM | POA: Diagnosis not present

## 2018-03-14 DIAGNOSIS — L578 Other skin changes due to chronic exposure to nonionizing radiation: Secondary | ICD-10-CM | POA: Diagnosis not present

## 2018-03-14 DIAGNOSIS — L812 Freckles: Secondary | ICD-10-CM | POA: Diagnosis not present

## 2018-03-14 DIAGNOSIS — Z1283 Encounter for screening for malignant neoplasm of skin: Secondary | ICD-10-CM | POA: Diagnosis not present

## 2018-03-14 DIAGNOSIS — D224 Melanocytic nevi of scalp and neck: Secondary | ICD-10-CM | POA: Diagnosis not present

## 2018-03-14 DIAGNOSIS — Z85828 Personal history of other malignant neoplasm of skin: Secondary | ICD-10-CM | POA: Diagnosis not present

## 2018-03-18 ENCOUNTER — Encounter: Payer: Self-pay | Admitting: Internal Medicine

## 2018-03-18 ENCOUNTER — Ambulatory Visit (INDEPENDENT_AMBULATORY_CARE_PROVIDER_SITE_OTHER): Payer: Medicare Other | Admitting: Internal Medicine

## 2018-03-18 VITALS — BP 126/82 | HR 84 | Ht 62.0 in | Wt 155.0 lb

## 2018-03-18 DIAGNOSIS — J4520 Mild intermittent asthma, uncomplicated: Secondary | ICD-10-CM | POA: Diagnosis not present

## 2018-03-18 NOTE — Patient Instructions (Signed)
Use albuterol 2-4 puffs every 4 hrs as needed for cough and wheezing

## 2018-03-18 NOTE — Progress Notes (Signed)
Name: HANNI MILFORD MRN: [id number] DOB: 21-Oct-1945     CONSULTATION DATE: 02/08/2018 REFERRING MD : Nicki Reaper  CHIEF COMPLAINT: Cough  STUDIES:  01/28/2018 CXR independently reviewed by Me Bilateral interstitial reticulonodular disease No focal opacifications    HISTORY OF PRESENT ILLNESS:  72 year old pleasant female seen today for follow up chronic cough with mucus production Has resolved with z pak and inhaled therapy  Patient is a non-smoker nonalcoholic No secondhand smoke exposure  No signs of infection at this time No signs of heart failure at this time      PAST MEDICAL HISTORY :   has a past medical history of Arthritis, Depression, GERD (gastroesophageal reflux disease), diverticulitis of colon, Hyperlipidemia, and Hypertension.  has a past surgical history that includes Foot surgery (Bilateral); Cesarean section; Breast biopsy (Left, 1995); Knee arthroscopy (Right, 2000); Cataract extraction, bilateral; Ganglion cyst excision (Left, 1965); Ganglion cyst excision (Right, 1980); Hysteroscopy w/D&C (N/A, 04/30/2017); and Colonoscopy with propofol (N/A, 05/15/2017). Prior to Admission medications   Medication Sig Start Date End Date Taking? Authorizing Provider  benazepril (LOTENSIN) 10 MG tablet TAKE 1 TABLET BY MOUTH DAILY 09/14/17   Einar Pheasant, MD  escitalopram (LEXAPRO) 10 MG tablet TAKE 1 TABLET BY MOUTH DAILY 12/05/17   Einar Pheasant, MD  hydrochlorothiazide (HYDRODIURIL) 25 MG tablet TAKE 1 TABLET BY MOUTH DAILY 12/05/17   Einar Pheasant, MD  meloxicam [facility]) 7.5 MG tablet  09/05/17   [provider]  mupirocin ointment (BACTROBAN) 2 % Apply to affected area bid. Patient not taking: Reported on 09/27/2017 06/28/17   Einar Pheasant, MD  naproxen sodium (ALEVE) 220 MG tablet Take 220 mg by mouth 2 (two) times daily as needed.    [provider]  omeprazole (PRILOSEC) 40 MG capsule  07/12/17   [provider]  potassium chloride (K-DUR)  10 MEQ tablet Take 1 tablet (10 mEq total) by mouth daily. 12/10/17   Einar Pheasant, MD  predniSONE (DELTASONE) 10 MG tablet Take 4 tablets x 1 day and then decrease by 1/2 tablet per day until down to zero mg. 01/28/18   Einar Pheasant, MD  Probiotic Product (PROBIOTIC PO) Take 1 tablet by mouth daily.    [provider]  simvastatin (ZOCOR) 40 MG tablet TAKE 1 TABLET BY MOUTH EVERY EVENING 01/07/18   Einar Pheasant, MD  zolpidem (AMBIEN) 10 MG tablet TAKE 1 TABLET BY MOUTH AT BEDTIME AS NEEDED FOR SLEEP 11/02/17   Einar Pheasant, MD   No Known Allergies  REVIEW OF SYSTEMS:   Constitutional: Negative for fever, chills, weight loss, malaise/fatigue and diaphoresis.  HENT: Negative for hearing loss, ear pain, nosebleeds, congestion, sore throat, neck pain, tinnitus and ear discharge.   Eyes: Negative for blurred vision, double vision, photophobia, pain, discharge and redness.  Respiratory: + Cough,-hemoptysis, + sputum production, + shortness of breath, + wheezing and-stridor.   Cardiovascular: Negative for chest pain, palpitations, orthopnea, claudication, leg swelling and PND.  ALL OTHER ROS ARE NEGATIVE  BP 126/82 (BP Location: Left Arm, Cuff Size: Normal)   Pulse 84   Ht 5\' 2"  (1.575 m)   Wt 155 lb (70.3 kg)   SpO2 95%   BMI 28.35 kg/m    Think he can qualify for a POC for so after retesting and I would like so he has a stable that on the POC Sudini 5 L I completed I have to put order for 6-minute  Physical Examination:   GENERAL:NAD, no fevers, chills, no weakness no fatigue HEAD:  Normocephalic, atraumatic.  EYES: Pupils equal, round, reactive to light. Extraocular muscles intact. No scleral icterus.  MOUTH: Moist mucosal membrane.   EAR, NOSE, THROAT: Clear without exudates. No external lesions.  NECK: Supple. No thyromegaly. No nodules. No JVD.  PULMONARY:CTA B/L - wheezes, no crackles, no rhonchi CARDIOVASCULAR: S1 and S2. Regular rate and rhythm. No murmurs, rubs,  or gallops. No edema.  GASTROINTESTINAL: Soft, nontender, nondistended. No masses. Positive bowel sounds.  MUSCULOSKELETAL: No swelling, clubbing, or edema. Range of motion full in all extremities.  NEUROLOGIC: Cranial nerves II through XII are intact. No gross focal neurological deficits.  SKIN: No ulceration, lesions, rashes, or cyanosis. Skin warm and dry. Turgor intact.  PSYCHIATRIC: Mood, affect within normal limits. The patient is awake, alert and oriented x 3. Insight, judgment intact.      ASSESSMENT / PLAN: 72 year old pleasant white female seen today for chronic cough with mucus production most likely related to acute bronchitis with reactive airways disease which has resolved with time and inhaled therapy  Recommend  albuterol inhaler 2 to 4 puffs every 4 hours as needed   Patient satisfied with Plan of action and management. All questions answered  Corrin Parker, M.D.  [facility]  Medical Director [place] Director [facility] Cardio-Pulmonary Department

## 2018-04-08 ENCOUNTER — Other Ambulatory Visit: Payer: Self-pay | Admitting: Internal Medicine

## 2018-05-02 ENCOUNTER — Other Ambulatory Visit: Payer: Self-pay | Admitting: Internal Medicine

## 2018-05-02 DIAGNOSIS — Z1231 Encounter for screening mammogram for malignant neoplasm of breast: Secondary | ICD-10-CM

## 2018-05-04 ENCOUNTER — Other Ambulatory Visit: Payer: Self-pay | Admitting: Internal Medicine

## 2018-05-09 ENCOUNTER — Other Ambulatory Visit: Payer: Self-pay | Admitting: Internal Medicine

## 2018-05-09 NOTE — Telephone Encounter (Signed)
Copied from [place] [phone]. Topic: Quick Communication - Rx Refill/Question >> May 09, 2018 12:21 PM Selinda Flavin B, [place] wrote: Medication: zolpidem (AMBIEN) 10 MG tablet  Has the patient contacted their pharmacy? Yes.   (Agent: If no, request that the patient contact the pharmacy for the refill.) (Agent: If yes, when and what did the pharmacy advise?)  Preferred Pharmacy (with phone number or street name): [place], [place] - [place]: Please be advised that RX refills may take up to 3 business days. We ask that you follow-up with your pharmacy.

## 2018-05-09 NOTE — Telephone Encounter (Signed)
Requested medication (s) are due for refill today: yes  Requested medication (s) are on the active medication list: yes    Last refill: 11/02/17  #30  1 refill  Future visit scheduled no  Notes to clinic:Not delegated  Requested Prescriptions  Pending Prescriptions Disp Refills   zolpidem (AMBIEN) 10 MG tablet 30 tablet 1    Sig: Take 1 tablet (10 mg total) by mouth at bedtime as needed. for sleep     Not Delegated - Psychiatry:  Anxiolytics/Hypnotics Failed - 05/09/2018 12:25 PM      Failed - This refill cannot be delegated      Failed - Urine Drug Screen completed in last 360 days.      Passed - Valid encounter within last 6 months    Recent Outpatient Visits          3 months ago Cough   [facility] [place], Randell Patient, MD   7 months ago Dysuria   [facility] [place], Randell Patient, MD   10 months ago Need for pneumococcal vaccination   [facility], MD   1 year ago Lower abdominal pain   [facility] [place], Randell Patient, MD   1 year ago Hypercholesterolemia   [facility] Einar Pheasant, MD

## 2018-05-10 MED ORDER — ZOLPIDEM TARTRATE 10 MG PO TABS: ORAL_TABLET | ORAL | 1 refills | Status: DC

## 2018-05-10 NOTE — Telephone Encounter (Signed)
ok'd rx for [employer] #30 with one refill.

## 2018-06-04 ENCOUNTER — Ambulatory Visit
Admission: RE | Admit: 2018-06-04 | Discharge: 2018-06-04 | Disposition: A | Discharge status: Home / Self Care | Payer: Medicare Other | Source: Ambulatory Visit | Attending: Internal Medicine | Admitting: Internal Medicine

## 2018-06-04 DIAGNOSIS — Z1231 Encounter for screening mammogram for malignant neoplasm of breast: Secondary | ICD-10-CM | POA: Insufficient documentation

## 2018-06-08 ENCOUNTER — Other Ambulatory Visit: Payer: Self-pay | Admitting: Internal Medicine

## 2018-07-01 ENCOUNTER — Telehealth: Payer: Self-pay | Admitting: Internal Medicine

## 2018-07-01 NOTE — Telephone Encounter (Signed)
Informed patient Christine Mullins out of the office until 07/11/18. Advised to go to urgent care or see pcp on 07/04/18. Pt scheduled f/u for 07/11/18 and verbalized understanding of what to do if she gets worse. She will take Delsym and muccinex for now. Nothing further needed.

## 2018-07-09 ENCOUNTER — Other Ambulatory Visit: Payer: Self-pay | Admitting: Internal Medicine

## 2018-07-11 ENCOUNTER — Encounter: Payer: Self-pay | Admitting: Internal Medicine

## 2018-07-11 ENCOUNTER — Ambulatory Visit (INDEPENDENT_AMBULATORY_CARE_PROVIDER_SITE_OTHER): Payer: Medicare Other | Admitting: Internal Medicine

## 2018-07-11 VITALS — BP 92/60 | HR 89 | Resp 16 | Ht 62.0 in | Wt 140.0 lb

## 2018-07-11 DIAGNOSIS — J4520 Mild intermittent asthma, uncomplicated: Secondary | ICD-10-CM | POA: Diagnosis not present

## 2018-07-11 NOTE — Progress Notes (Signed)
   Name: Christine Mullins MRN: [id number] DOB: 09-11-1945     CONSULTATION DATE: 02/08/2018 REFERRING MD : Nicki Reaper  CHIEF COMPLAINT: Cough  STUDIES:  01/28/2018 CXR independently reviewed by Me Bilateral interstitial reticulonodular disease No focal opacifications    HISTORY OF PRESENT ILLNESS:  Follow-up chronic cough and mucus production Had previously resolved with Z-Pak therapy and inhalers  Patient had severe viral infection of the Christmas holiday Patient used Delsym and Mucinex as needed Patient diagnosed herself with laryngitis She is a non-smoker nonalcoholic no secondhand smoke exposure  No signs of infection at this Time no signs of heart failure at this time  Patient was prescribed Advair HFA however she did not use that properly  Patient still has a persistent intermittent cough along with some mucus production    Review of Systems:  Gen:  Denies  fever, sweats, chills weigh loss  HEENT: Denies blurred vision, double vision, ear pain, eye pain, hearing loss, nose bleeds, sore throat Cardiac:  No dizziness, chest pain or heaviness, chest tightness,edema, No JVD Resp:+cough, +sputum production, -shortness of breath,-wheezing, -hemoptysis,  Gi: Denies swallowing difficulty, stomach pain, nausea or vomiting, diarrhea, constipation, bowel incontinence Gu:  Denies bladder incontinence, burning urine Ext:   Denies Joint pain, stiffness or swelling Skin: Denies  skin rash, easy bruising or bleeding or hives Endoc:  Denies polyuria, polydipsia , polyphagia or weight change Psych:   Denies depression, insomnia or hallucinations  Other:  All other systems negative    BP 92/60 (BP Location: Left Arm, Cuff Size: Normal)   Pulse 89   Resp 16   Ht 5\' 2"  (1.575 m)   Wt 140 lb (63.5 kg)   SpO2 96%   BMI 25.61 kg/m    Physical Examination:   GENERAL:NAD, no fevers, chills, no weakness no fatigue HEAD: Normocephalic, atraumatic.  EYES: Pupils equal, round, reactive  to light. Extraocular muscles intact. No scleral icterus.  MOUTH: Moist mucosal membrane. Dentition intact. No abscess noted.  EAR, NOSE, THROAT: Clear without exudates. No external lesions.  NECK: Supple. No thyromegaly. No nodules. No JVD.  PULMONARY: CTA B/L no wheezing, rhonchi, crackles CARDIOVASCULAR: S1 and S2. Regular rate and rhythm. No murmurs, rubs, or gallops. No edema. Pedal pulses 2+ bilaterally.  GASTROINTESTINAL: Soft, nontender, nondistended. No masses. Positive bowel sounds. No hepatosplenomegaly.  MUSCULOSKELETAL: No swelling, clubbing, or edema. Range of motion full in all extremities.  NEUROLOGIC: Cranial nerves II through XII are intact. No gross focal neurological deficits. Sensation intact. Reflexes intact.  SKIN: No ulceration, lesions, rashes, or cyanosis. Skin warm and dry. Turgor intact.  PSYCHIATRIC: Mood, affect within normal limits. The patient is awake, alert and oriented x 3. Insight, judgment intact.  ALL OTHER ROS ARE NEGATIVE            ASSESSMENT / PLAN: 73 year old pleasant white female seen today for chronic cough with mucus production most likely related to acute on chronic bronchitis with intermittent reactive airways disease  At this time I would recommend starting Advair HFA 2 puffs daily Also recommend albuterol inhaler 2 to 4 puffs every 4 hours as needed  Recommend to avoid all types of allergens and exposures      Patient satisfied with Plan of action and management. All questions answered\ Follow-up in 3 months for reassessment  Bibi Economos Patricia Pesa, M.D.  [facility]  Medical Director [place] Director [facility] Cardio-Pulmonary Department

## 2018-07-11 NOTE — Patient Instructions (Signed)
Albuterol as needed  Start ADVAIR HFA 2 puffs daily

## 2018-07-11 NOTE — Telephone Encounter (Signed)
Per last refill, she is taking 10mg  1/2 tablet per day.  She was given #30 with one refill on 05/10/18.  Should have another refill.  Please confirm with pt.

## 2018-07-24 NOTE — Telephone Encounter (Signed)
Confirmed with patient that she does not need this meditcation. She has plenty left and will call for refill

## 2018-07-25 DIAGNOSIS — H43822 Vitreomacular adhesion, left eye: Secondary | ICD-10-CM | POA: Diagnosis not present

## 2018-07-26 ENCOUNTER — Telehealth: Payer: Self-pay | Admitting: Internal Medicine

## 2018-07-26 NOTE — Telephone Encounter (Signed)
Called and spoke to pt. Pt reports of increased cough & fatigue- cough is prod with clear to pale yellow mucus. Pt states cough is taking her breathe and is making it difficult to sleep. Pt denied additional symptoms.  pt using albuterol HFA & delsym TID with no improvement.  Pt has been scheduled for acute visit on 07/30/18 with Dr. Mortimer Fries. Pt declined sooner appt, as she prefers to see Dr. Mortimer Fries.  Advised pt to go to ED or UC if sx worsen over the weekend. Nothing further is needed.

## 2018-07-30 ENCOUNTER — Ambulatory Visit (INDEPENDENT_AMBULATORY_CARE_PROVIDER_SITE_OTHER): Payer: Medicare Other | Admitting: Internal Medicine

## 2018-07-30 ENCOUNTER — Ambulatory Visit
Admission: RE | Admit: 2018-07-30 | Discharge: 2018-07-30 | Disposition: A | Discharge status: Home / Self Care | Payer: Medicare Other | Attending: Internal Medicine | Admitting: Internal Medicine

## 2018-07-30 ENCOUNTER — Encounter: Payer: Self-pay | Admitting: Internal Medicine

## 2018-07-30 ENCOUNTER — Ambulatory Visit
Admission: RE | Admit: 2018-07-30 | Discharge: 2018-07-30 | Disposition: A | Discharge status: Home / Self Care | Payer: Medicare Other | Source: Ambulatory Visit | Attending: Internal Medicine | Admitting: Internal Medicine

## 2018-07-30 VITALS — Resp 16 | Ht 62.0 in | Wt 140.0 lb

## 2018-07-30 DIAGNOSIS — R05 Cough: Secondary | ICD-10-CM

## 2018-07-30 DIAGNOSIS — R059 Cough, unspecified: Secondary | ICD-10-CM

## 2018-07-30 DIAGNOSIS — J208 Acute bronchitis due to other specified organisms: Secondary | ICD-10-CM | POA: Diagnosis not present

## 2018-07-30 MED ORDER — PREDNISONE 20 MG PO TABS: 20.0000 mg | ORAL_TABLET | Freq: Every day | ORAL | 0 refills | Status: DC

## 2018-07-30 MED ORDER — GUAIFENESIN-CODEINE 100-10 MG/5ML PO SOLN: 5.0000 mL | ORAL | 0 refills | Status: DC | PRN

## 2018-07-30 NOTE — Patient Instructions (Addendum)
Obtain CXR 2 View  Continue Inhalers with ADVAIR  Continue Albuterol as needed  Prednisone 20 mg daily for 5 days  Robitussin with Codeine as needed

## 2018-07-30 NOTE — Progress Notes (Signed)
Name: Christine Mullins MRN: [id number] DOB: Aug 21, 1945     CONSULTATION DATE: 02/08/2018 REFERRING MD : Nicki Reaper  CHIEF COMPLAINT: Cough  STUDIES:  01/28/2018 CXR independently reviewed by Me Bilateral interstitial reticulonodular disease No focal opacifications    HISTORY OF PRESENT ILLNESS: Follow-up chronic cough and mucus production Patient has had acute bout of bronchitis last week  +sick contacts exposed several weeks ago She had mucous productive cough intermittent wheezing  Patient feels better today but still has a residual cough Green mucus productive cough  Patient takes Advair HFA and albuterol as needed and seems to be helping  She has had several viral infections over the last several months Non-smoker nonalcoholic no secondhand smoke exposure   She is exposed to her grandkids a lot as well as exposure to sick contacts   She still has a persistent intermittent productive cough with mucus production     Review of Systems:  Gen:  Denies  fever, sweats, chills weigh loss  HEENT: Denies blurred vision, double vision, ear pain, eye pain, hearing loss, nose bleeds, sore throat Cardiac:  No dizziness, chest pain or heaviness, chest tightness,edema, No JVD Resp:   + cough, +sputum production, +shortness of breath,+wheezing, -hemoptysis,  Gi: Denies swallowing difficulty, stomach pain, nausea or vomiting, diarrhea, constipation, bowel incontinence Gu:  Denies bladder incontinence, burning urine Ext:   Denies Joint pain, stiffness or swelling Skin: Denies  skin rash, easy bruising or bleeding or hives Endoc:  Denies polyuria, polydipsia , polyphagia or weight change Psych:   Denies depression, insomnia or hallucinations  Other:  All other systems negative   Resp 16   Ht 5\' 2"  (1.575 m)   Wt 140 lb (63.5 kg)   BMI 25.61 kg/m   Physical Examination:   GENERAL:NAD, no fevers, chills, no weakness no fatigue HEAD: Normocephalic, atraumatic.  EYES: Pupils equal,  round, reactive to light. Extraocular muscles intact. No scleral icterus.  MOUTH: Moist mucosal membrane. Dentition intact. No abscess noted.  EAR, NOSE, THROAT: Clear without exudates. No external lesions.  NECK: Supple. No thyromegaly. No nodules. No JVD.  PULMONARY: CTA B/L+crackles RT lung base CARDIOVASCULAR: S1 and S2. Regular rate and rhythm. No murmurs, rubs, or gallops. No edema. Pedal pulses 2+ bilaterally.  GASTROINTESTINAL: Soft, nontender, nondistended. No masses. Positive bowel sounds. No hepatosplenomegaly.  MUSCULOSKELETAL: No swelling, clubbing, or edema. Range of motion full in all extremities.  NEUROLOGIC: Cranial nerves II through XII are intact. No gross focal neurological deficits. Sensation intact. Reflexes intact.  SKIN: No ulceration, lesions, rashes, or cyanosis. Skin warm and dry. Turgor intact.  PSYCHIATRIC: Mood, affect within normal limits. The patient is awake, alert and oriented x 3. Insight, judgment intact.  ALL OTHER ROS ARE NEGATIVE        ASSESSMENT / PLAN: 73 year old pleasant white female seen today for acute bronchitis with a history of chronic bronchitis with intermittent reactive airways disease  Obtain chest xray-patient with RT base crackles on exam Patient probably will have R lung opacity-atelactasis  Acute bronchitis Prednisone 20 mg for 5 days Continue inhalers as prescribed No indication for antibiotics at this time  Chronic bronchitis Continue inhalers as prescribed with Advair HFA Albuterol as needed   persistent cough with mucus production Robitussin with codeine as needed  Recommend to avoid all types of allergens and exposures and sick contacts    Patient satisfied with Plan of action and management. All questions answered Follow up in 2 months  Ma Munoz Patricia Pesa, M.D.  [facility]  Pulmonary & Critical Care Medicine  Medical Director [place] Director [facility] Cardio-Pulmonary Department

## 2018-08-06 ENCOUNTER — Telehealth: Payer: Self-pay

## 2018-08-06 NOTE — Telephone Encounter (Signed)
Called and spoke to patient, let her know of Dr. Zoila Shutter response to her MyChart message. (see previous encounter). She states she is continuing to improve and will call us back if she feels she is not. Nothing further needed at this time.

## 2018-08-15 DIAGNOSIS — M1812 Unilateral primary osteoarthritis of first carpometacarpal joint, left hand: Secondary | ICD-10-CM | POA: Diagnosis not present

## 2018-09-09 ENCOUNTER — Encounter: Payer: Self-pay | Admitting: Internal Medicine

## 2018-09-09 ENCOUNTER — Ambulatory Visit (INDEPENDENT_AMBULATORY_CARE_PROVIDER_SITE_OTHER): Payer: Medicare Other | Admitting: Internal Medicine

## 2018-09-09 ENCOUNTER — Other Ambulatory Visit: Payer: Self-pay | Admitting: Internal Medicine

## 2018-09-09 VITALS — BP 110/58 | HR 80 | Ht 62.0 in | Wt 137.4 lb

## 2018-09-09 DIAGNOSIS — J208 Acute bronchitis due to other specified organisms: Secondary | ICD-10-CM

## 2018-09-09 MED ORDER — IPRATROPIUM-ALBUTEROL 0.5-2.5 (3) MG/3ML IN SOLN: 3.0000 mL | Freq: Three times a day (TID) | RESPIRATORY_TRACT | 5 refills | Status: DC

## 2018-09-09 MED ORDER — BENZONATATE 200 MG PO CAPS: 200.0000 mg | ORAL_CAPSULE | Freq: Three times a day (TID) | ORAL | 0 refills | Status: DC | PRN

## 2018-09-09 MED ORDER — IPRATROPIUM-ALBUTEROL 0.5-2.5 (3) MG/3ML IN SOLN
3.0000 mL | Freq: Once | RESPIRATORY_TRACT | Status: AC
  Administered 2018-09-09: 3 mL via RESPIRATORY_TRACT

## 2018-09-09 NOTE — Patient Instructions (Addendum)
Will start on nebulizer with duonebs to be used three times daily.   Follow up with Dr. Mortimer Fries in 3-4 weeks.

## 2018-09-09 NOTE — Progress Notes (Signed)
   Name: Christine Mullins MRN: [id number] DOB: 1946-05-19     CONSULTATION DATE: 02/08/2018 REFERRING MD : Nicki Reaper  CHIEF COMPLAINT: Cough     HISTORY OF PRESENT ILLNESS: The patient is a 73 year old female patient of Dr. Mortimer Fries, she comes in today as an acute visit due to acute bronchitis.  She has been followed due to chronic bronchitis with persistent cough, excess mucus production.   Today the patient has heavy breathing, though no conversational dyspnea. She feels that her breathing is worse than usual, and has been having dyspnea on walking around her home and going up stairs. It gets better with rest. She has cough and "feels like I am literally gagging and choking to death". She notes that a week ago she had blood in sputum, which has not resolved.  She has hot stabbing pain in her right side when she is coughing.   She stopped all inhalers 3 days ago because she wanted to "start from baseline". She was on a purpley-blue inhaler. She tried canned oxygen too.   Review of Systems:  Constitutional: Feels well. Cardiovascular: Denies chest pain, exertional chest pain.  Pulmonary: Denies hemoptysis, pleuritic chest pain.   The remainder of systems were reviewed and were found to be negative other than what is documented in the HPI.    Physical Examination:   VS: BP (!) 110/58 (BP Location: Left Arm, Cuff Size: Normal)   Pulse 80   Ht 5\' 2"  (1.575 m)   Wt 137 lb 6.4 oz (62.3 kg)   SpO2 98%   BMI 25.13 kg/m   General Appearance: No distress  Neuro:without focal findings, mental status, speech normal, alert and oriented HEENT: PERRLA, EOM intact Pulmonary: No wheezing, No rales  CardiovascularNormal S1,S2.  No m/r/g.  Abdomen: Benign, Soft, non-tender, No masses Renal:  No costovertebral tenderness  GU:  No performed at this time. Endoc: No evident thyromegaly, no signs of acromegaly or Cushing features Skin:   warm, no rashes, no ecchymosis  Extremities: normal, no cyanosis,  clubbing.      ASSESSMENT / PLAN: 73 year old pleasant white female seen today for acute bronchitis with a history of chronic bronchitis with intermittent reactive airways disease  Chronic bronchitis with acute bronchitis. - Patient stopped all inhalers on her own this past week. - I have started her on DuoNeb to be used 3 times daily.  She will also be given a dose in the office today.   Recommend to avoid all types of allergens and exposures and sick contacts    Deep Ashby Dawes, M.D., F.C.C.P.  Board Certified in Internal Medicine, Pulmonary Medicine, [place], and Sleep Medicine.  [facility] Office Number: [phone]

## 2018-09-09 NOTE — Addendum Note (Signed)
Addended by: Darreld Mclean on: 09/09/2018 05:11 PM   Modules accepted: Orders

## 2018-09-10 ENCOUNTER — Ambulatory Visit: Payer: Medicare Other | Admitting: Internal Medicine

## 2018-09-17 ENCOUNTER — Other Ambulatory Visit: Payer: Self-pay | Admitting: Internal Medicine

## 2018-09-17 ENCOUNTER — Telehealth: Payer: Self-pay | Admitting: Internal Medicine

## 2018-09-17 NOTE — Telephone Encounter (Signed)
Ambien refill request  Pt of Dr. Nicki Reaper

## 2018-09-17 NOTE — Telephone Encounter (Signed)
Dr. Nicki Reaper would rather you approve or not approve this medication for this patient.  Please advise.  Pt is requesting Ambien.  Gae Bon, CMA

## 2018-09-17 NOTE — Telephone Encounter (Signed)
Copied from [facility] [id number]. Topic: Quick Communication - Rx Refill/Question >> Sep 17, 2018 12:31 PM Leward Quan A wrote: Medication: zolpidem (AMBIEN) 10 MG tablet   Has the patient contacted their pharmacy? Yes.   (Agent: If no, request that the patient contact the pharmacy for the refill.) (Agent: If yes, when and what did the pharmacy advise?)  Preferred Pharmacy (with phone number or street name): [facility], [place] - [place] [phone] (Phone) [phone] (Fax)    Agent: Please be advised that RX refills may take up to 3 business days. We ask that you follow-up with your pharmacy.

## 2018-09-18 NOTE — Telephone Encounter (Signed)
rx ok'd for ambien #30 with one refill.

## 2018-09-24 ENCOUNTER — Encounter: Payer: Medicare Other | Admitting: Internal Medicine

## 2018-10-08 ENCOUNTER — Other Ambulatory Visit: Payer: Self-pay | Admitting: Internal Medicine

## 2018-10-09 ENCOUNTER — Telehealth: Payer: Medicare Other | Admitting: Internal Medicine

## 2018-10-11 ENCOUNTER — Other Ambulatory Visit: Payer: Self-pay

## 2018-10-11 ENCOUNTER — Encounter: Payer: Self-pay | Admitting: Internal Medicine

## 2018-10-11 ENCOUNTER — Ambulatory Visit (INDEPENDENT_AMBULATORY_CARE_PROVIDER_SITE_OTHER): Payer: Medicare Other | Admitting: Internal Medicine

## 2018-10-11 DIAGNOSIS — J309 Allergic rhinitis, unspecified: Secondary | ICD-10-CM | POA: Diagnosis not present

## 2018-10-11 DIAGNOSIS — R0789 Other chest pain: Secondary | ICD-10-CM

## 2018-10-11 DIAGNOSIS — J4520 Mild intermittent asthma, uncomplicated: Secondary | ICD-10-CM

## 2018-10-11 MED ORDER — MOMETASONE FUROATE 50 MCG/ACT NA SUSP: 2.0000 | Freq: Every day | NASAL | 2 refills | Status: DC

## 2018-10-11 MED ORDER — CETIRIZINE HCL 10 MG PO TABS: 10.0000 mg | ORAL_TABLET | Freq: Every day | ORAL | 6 refills | Status: DC

## 2018-10-11 NOTE — Progress Notes (Signed)
Name: Christine Mullins MRN: [id number] DOB: 1946/06/17     CONSULTATION DATE: 02/08/2018 REFERRING MD : Nicki Reaper  CHIEF COMPLAINT: Cough  STUDIES:  01/28/2018 CXR independently reviewed by Me Bilateral interstitial reticulonodular disease No focal opacifications      VIDEO/TELEPHONE VISIT    In the setting of the current Covid19 crisis, you are scheduled for a  visit with me on 10/11/2018  Just as we do with many in-office visits, in order for you to participate in this visit, we must obtain consent.   I can obtain your verbal consent now.  PATIENT AGREES AND CONFIRMS -YES This Visit has Audio and Visual Capabilities for optimal patient care experience   Evaluation Performed:  Follow-up visit  This visit type was conducted due to national recommendations for restrictions regarding the COVID-19 Pandemic (e.g. social distancing).  This format is felt to be most appropriate for this patient at this time.  All issues noted in this document were discussed and addressed.     Virtual Visit via Telephone Note    I connected with patient on 10/11/2018  by telephone and verified that I am speaking with the correct person using two identifiers.   I discussed the limitations, risks, security and privacy concerns of performing an evaluation and management service by telephone and the availability of in person appointments. I also discussed with the patient that there may be a patient responsible charge related to this service. The patient expressed understanding and agreed to proceed.   Location of the patient: Home Location of provider: OFFICE Participating persons: Patient and provider only   HISTORY OF PRESENT ILLNESS:  Follow up chronic Cough and mucus production +constant throat clearing  Patient was given NEBS at last OV and was doing well with NEB therapy Also takes Diphenhydramine as needed    +exertional chest discomfort with pleurisy No signs of infection   Review of  Systems:  Gen:  Denies  fever, sweats, chills weigh loss  HEENT: Denies blurred vision, double vision, ear pain, eye pain, hearing loss, nose bleeds, sore throat Cardiac:  No dizziness, +chest tightness Resp:   + cough, +sputum production, +shortness of breath,+wheezing, -hemoptysis,  Gi: Denies swallowing difficulty, stomach pain, nausea or vomiting, diarrhea, constipation, bowel incontinence Gu:  Denies bladder incontinence, burning urine Ext:   Denies Joint pain, stiffness or swelling Skin: Denies  skin rash, easy bruising or bleeding or hives Endoc:  Denies polyuria, polydipsia , polyphagia or weight change Psych:   Denies depression, insomnia or hallucinations  Other:  All other systems negative    Vital Signs As per patient BP 103/62 oxygen sats 91% HR 77-111  With exertion 134 pounds         ASSESSMENT / PLAN: 73 year old pleasant white female follow up  today for  chronic bronchitis with intermittent reactive airways disease with signs and symptoms allergic rhinitis  Chronic bronchitis reactive airways disease  DOUNEBS  as needed helps alot  persistent cough with mucus production Related to allergic rhinitis Start Nasonex  Start Zyrtec  atypical chest pain with h/o HTN and High Cholesterol Cardiology referral    GERD continue PPI  Recommend to avoid all types of allergens and exposures and sick contacts   COVID-19 Education: The signs and symptoms of COVID-19 were discussed with the patient and how to seek care for testing (follow up with PCP or arrange E-visit).  The importance of social distancing was discussed today.   Patient satisfied with Plan of action and management.  All questions answered Follow up in 1 month  Sherea Liptak Patricia Pesa, M.D.  [facility]  Medical Director [place] Director [place] Department    Total Time on phone 27 mins

## 2018-10-11 NOTE — Patient Instructions (Addendum)
NASONEX 2 sprays each nostril daily  Zyrtec 10 mg daily   GERD continue Nexium  Continue DOUNEBS as needed as prescribed   PFT's in 4-6 weeks  Referral for Cardiology for atypical chest pain-patient is worried

## 2018-10-11 NOTE — Addendum Note (Signed)
Addended by: Darreld Mclean on: 10/11/2018 01:38 PM   Modules accepted: Orders

## 2018-11-04 ENCOUNTER — Telehealth: Payer: Self-pay | Admitting: Internal Medicine

## 2018-11-04 DIAGNOSIS — R0602 Shortness of breath: Secondary | ICD-10-CM

## 2018-11-04 NOTE — Telephone Encounter (Signed)
Copied from [facility] [id number]. Topic: Quick Communication - Rx Refill/Question >> Nov 04, 2018 11:41 AM Rayann Heman wrote: Medication: zolpidem (AMBIEN) 10 MG tablet [[id number]] needs early refill. States that she has has to take a whole tablet a day

## 2018-11-04 NOTE — Telephone Encounter (Signed)
Patient is needing an early refill because in the last 2 weeks she has been taking 1 whole pill every night. She has increased due to bronchitis. She is coughing at night and cannot sleep. 1/2 tab was not helping so she increased to 1 tablet. Pulmonary is treating. Per patient, they are aware that she has increased the [place]. She normally takes 1/2 tablet q hs prn. She was given 30 tablets on 09/18/18 which should have lasted 2 months. She is out of medication. [facility] is stating [facility] can not fill with out approval because the rx is written for 1/2 tablet at bedtime prn.

## 2018-11-04 NOTE — Telephone Encounter (Signed)
Please call pt and let pt know that I can refill the ambien early this one time, but she needs to take [place] - only 1/2 tablet q hs.  If cough is keeping her awake, I recommend following up with pulmonary if they are following her for the cough/bronchitis.

## 2018-11-04 NOTE — Telephone Encounter (Signed)
Verbal given to [facility] to fill early x1 time

## 2018-11-04 NOTE — Telephone Encounter (Signed)
Patient is aware and going to consult with Pulmonary

## 2018-11-04 NOTE — Telephone Encounter (Signed)
Called and spoke to patient. She states she is still having some shortness of breath, O2 90%, HR 110 and cough with clear mucus production. Has some right sided pain every time she breaths from breastbone to back, started Friday- Saturday.  Denies fever, chills. Has been taking a sleeping pill to sleep at night. Per Dr. Enid Derry last instruction, has not been using inhalers, just Duo-neb 2-3 times per day.   Dr. Mortimer Fries please advise.

## 2018-11-06 ENCOUNTER — Other Ambulatory Visit: Payer: Self-pay

## 2018-11-06 ENCOUNTER — Telehealth: Payer: Self-pay

## 2018-11-06 ENCOUNTER — Other Ambulatory Visit: Payer: Self-pay | Admitting: Internal Medicine

## 2018-11-06 ENCOUNTER — Ambulatory Visit
Admission: RE | Admit: 2018-11-06 | Discharge: 2018-11-06 | Disposition: A | Discharge status: Home / Self Care | Payer: Medicare Other | Source: Ambulatory Visit | Attending: Internal Medicine | Admitting: Internal Medicine

## 2018-11-06 DIAGNOSIS — R0602 Shortness of breath: Secondary | ICD-10-CM | POA: Diagnosis not present

## 2018-11-06 DIAGNOSIS — J181 Lobar pneumonia, unspecified organism: Secondary | ICD-10-CM

## 2018-11-06 DIAGNOSIS — J208 Acute bronchitis due to other specified organisms: Secondary | ICD-10-CM

## 2018-11-06 DIAGNOSIS — R05 Cough: Secondary | ICD-10-CM

## 2018-11-06 DIAGNOSIS — R059 Cough, unspecified: Secondary | ICD-10-CM

## 2018-11-06 LAB — POCT I-STAT CREATININE: Creatinine, Ser: 0.8 mg/dL (ref 0.44–1.00)

## 2018-11-06 MED ORDER — PREDNISONE 20 MG PO TABS: 20.0000 mg | ORAL_TABLET | Freq: Every day | ORAL | 0 refills | Status: DC

## 2018-11-06 MED ORDER — GUAIFENESIN-CODEINE 100-10 MG/5ML PO SOLN: 5.0000 mL | ORAL | 0 refills | Status: DC | PRN

## 2018-11-06 MED ORDER — AMOXICILLIN-POT CLAVULANATE 875-125 MG PO TABS: 1.0000 | ORAL_TABLET | Freq: Two times a day (BID) | ORAL | 0 refills | Status: AC

## 2018-11-06 MED ORDER — HYDROCOD POLST-CPM POLST ER 10-8 MG/5ML PO SUER: 5.0000 mL | ORAL | 0 refills | Status: DC | PRN

## 2018-11-06 MED ORDER — IOHEXOL 350 MG/ML SOLN
75.0000 mL | Freq: Once | INTRAVENOUS | Status: AC | PRN
  Administered 2018-11-06: 14:00:00 75 mL via INTRAVENOUS

## 2018-11-06 NOTE — Telephone Encounter (Signed)
Called and spoke to patient to let her know of Dr. Zoila Shutter recommendations. She is aware of CT and we will call her to schedule. Dr. Mortimer Fries will call with results. Nothing further at this time.

## 2018-11-06 NOTE — Telephone Encounter (Signed)
Patient is aware that she can get Robitussin DM OTC if needed for cough.   Original RX printed for Tussionex. Placed in DK folder for singing.

## 2018-11-06 NOTE — Addendum Note (Signed)
Addended by: Maryanna Shape A on: 11/06/2018 11:15 AM   Modules accepted: Orders

## 2018-11-06 NOTE — Progress Notes (Signed)
CT CHEST WITH EXTENSIVE B/L PNEUMONIA-RT LOWER LOBE PReDOM  AUGMENTIN 875 BID for 10 days PREDNISONE 20 mg for 7 days Robi with Codeine as needed for cough

## 2018-11-06 NOTE — Telephone Encounter (Signed)
Pt calling about cough syrup. Stated that she was told there would be an rx to pick up here at office.

## 2018-11-06 NOTE — Telephone Encounter (Signed)
I have Visist with her tomorrow  Can we get CT chest With Contrast to assess for PE before OV tomorrow?

## 2018-11-06 NOTE — Telephone Encounter (Signed)
Orders entered. Sent to [facility].

## 2018-11-06 NOTE — Telephone Encounter (Signed)
-----   Message from Flora Lipps, MD sent at 11/06/2018  2:52 PM EDT ----- Regarding: Pneumonia  Looks like [name] hast nasty  pneumonia.   Can we get her/prescribe  1. Augmentin 875mg  BID for 10 days  2.prednisone 20 mg daily for 7 days  3.Robitussin with Codeine 66ml very 4 hrs as needed(120ML) bottle.

## 2018-11-07 ENCOUNTER — Other Ambulatory Visit: Payer: Self-pay

## 2018-11-11 ENCOUNTER — Other Ambulatory Visit: Payer: Self-pay | Admitting: Internal Medicine

## 2018-11-11 NOTE — Addendum Note (Signed)
Addended by: Darreld Mclean on: 11/11/2018 10:23 AM   Modules accepted: Orders

## 2018-11-11 NOTE — Telephone Encounter (Signed)
Spoke to patient, let her know rx is ready for pickup. Will come by and pick up today.  Patient also questioned if she was to have follow-up imaging. Verbally per Dr. Mortimer Fries she needs repeat CT scan in 2 months. Order entered.

## 2018-11-12 ENCOUNTER — Ambulatory Visit: Payer: Medicare Other

## 2018-11-13 ENCOUNTER — Telehealth: Payer: Self-pay | Admitting: Internal Medicine

## 2018-11-13 NOTE — Telephone Encounter (Signed)
Virtual Visit Pre-Appointment Phone Call  "(Name), I am calling you today to discuss your upcoming appointment. We are currently trying to limit exposure to the virus that causes COVID-19 by seeing patients at home rather than in the office."  1. "What is the BEST phone number to call the day of the visit?" - include this in appointment notes  2. Do you have or have access to (through a family member/friend) a smartphone with video capability that we can use for your visit?" a. If yes - list this number in appt notes as cell (if different from BEST phone #) and list the appointment type as a VIDEO visit in appointment notes b. If no - list the appointment type as a PHONE visit in appointment notes  3. Confirm consent - "In the setting of the current Covid19 crisis, you are scheduled for a (phone or video) visit with your provider on (date) at (time).  Just as we do with many in-office visits, in order for you to participate in this visit, we must obtain consent.  If you'd like, I can send this to your mychart (if signed up) or email for you to review.  Otherwise, I can obtain your verbal consent now.  All virtual visits are billed to your insurance company just like a normal visit would be.  By agreeing to a virtual visit, we'd like you to understand that the technology does not allow for your provider to perform an examination, and thus may limit your provider's ability to fully assess your condition. If your provider identifies any concerns that need to be evaluated in person, we will make arrangements to do so.  Finally, though the technology is pretty good, we cannot assure that it will always work on either your or our end, and in the setting of a video visit, we may have to convert it to a phone-only visit.  In either situation, we cannot ensure that we have a secure connection.  Are you willing to proceed?" STAFF: Did the patient verbally acknowledge consent to telehealth visit? Document  YES/NO here: YES  4. Advise patient to be prepared - "Two hours prior to your appointment, go ahead and check your blood pressure, pulse, oxygen saturation, and your weight (if you have the equipment to check those) and write them all down. When your visit starts, your provider will ask you for this information. If you have an Apple Watch or Kardia device, please plan to have heart rate information ready on the day of your appointment. Please have a pen and paper handy nearby the day of the visit as well."  5. Give patient instructions for MyChart download to smartphone OR Doximity/Doxy.me as below if video visit (depending on what platform provider is using)  6. Inform patient they will receive a phone call 15 minutes prior to their appointment time (may be from unknown caller ID) so they should be prepared to answer    TELEPHONE CALL NOTE  Christine Mullins has been deemed a candidate for a follow-up tele-health visit to limit community exposure during the Covid-19 pandemic. I spoke with the patient via phone to ensure availability of phone/video source, confirm preferred email & phone number, and discuss instructions and expectations.  I reminded Christine Mullins to be prepared with any vital sign and/or heart rhythm information that could potentially be obtained via home monitoring, at the time of her visit. I reminded Christine Mullins to expect a phone call prior to  her visit.  Ace Gins 11/13/2018 3:41 PM   INSTRUCTIONS FOR DOWNLOADING THE MYCHART APP TO SMARTPHONE  - The patient must first make sure to have activated MyChart and know their login information - If Apple, go to [employer] and type in MyChart in the search bar and download the app. If Android, ask patient to go to [employer] and type in [place] in the search bar and download the app. The app is free but as with any other app downloads, their phone may require them to verify saved payment information or Apple/Android password.    - The patient will need to then log into the app with their MyChart username and password, and select [facility] as their healthcare provider to link the account. When it is time for your visit, go to the MyChart app, find appointments, and click Begin Video Visit. Be sure to Select Allow for your device to access the Microphone and Camera for your visit. You will then be connected, and your provider will be with you shortly.  **If they have any issues connecting, or need assistance please contact MyChart service desk [phone])**  **If using a computer, in order to ensure the best quality for their visit they will need to use either of the following Internet Browsers: [employer], or Google Chrome**  IF USING DOXIMITY or DOXY.ME - The patient will receive a link just prior to their visit by text.     FULL LENGTH CONSENT FOR TELE-HEALTH VISIT   I hereby voluntarily request, consent and authorize [facility] and its employed or contracted physicians, physician assistants, nurse practitioners or other licensed health care professionals (the Practitioner), to provide me with telemedicine health care services (the Services") as deemed necessary by the treating Practitioner. I acknowledge and consent to receive the Services by the Practitioner via telemedicine. I understand that the telemedicine visit will involve communicating with the Practitioner through live audiovisual communication technology and the disclosure of certain medical information by electronic transmission. I acknowledge that I have been given the opportunity to request an in-person assessment or other available alternative prior to the telemedicine visit and am voluntarily participating in the telemedicine visit.  I understand that I have the right to withhold or withdraw my consent to the use of telemedicine in the course of my care at any time, without affecting my right to future care or treatment, and that  the Practitioner or I may terminate the telemedicine visit at any time. I understand that I have the right to inspect all information obtained and/or recorded in the course of the telemedicine visit and may receive copies of available information for a reasonable fee.  I understand that some of the potential risks of receiving the Services via telemedicine include:   Delay or interruption in medical evaluation due to technological equipment failure or disruption;  Information transmitted may not be sufficient (e.g. poor resolution of images) to allow for appropriate medical decision making by the Practitioner; and/or   In rare instances, security protocols could fail, causing a breach of personal health information.  Furthermore, I acknowledge that it is my responsibility to provide information about my medical history, conditions and care that is complete and accurate to the best of my ability. I acknowledge that Practitioner's advice, recommendations, and/or decision may be based on factors not within their control, such as incomplete or inaccurate data provided by me or distortions of diagnostic images or specimens that may result from electronic transmissions. I  understand that the practice of medicine is not an exact science and that Practitioner makes no warranties or guarantees regarding treatment outcomes. I acknowledge that I will receive a copy of this consent concurrently upon execution via email to the email address I last provided but may also request a printed copy by calling the office of [facility].    I understand that my insurance will be billed for this visit.   I have read or had this consent read to me.  I understand the contents of this consent, which adequately explains the benefits and risks of the Services being provided via telemedicine.   I have been provided ample opportunity to ask questions regarding this consent and the Services and have had my questions answered to  my satisfaction.  I give my informed consent for the services to be provided through the use of telemedicine in my medical care  By participating in this telemedicine visit I agree to the above.

## 2018-11-18 ENCOUNTER — Ambulatory Visit: Payer: Medicare Other | Admitting: Internal Medicine

## 2018-11-22 ENCOUNTER — Encounter: Payer: Self-pay | Admitting: Internal Medicine

## 2018-11-22 ENCOUNTER — Telehealth (INDEPENDENT_AMBULATORY_CARE_PROVIDER_SITE_OTHER): Payer: Medicare Other | Admitting: Internal Medicine

## 2018-11-22 ENCOUNTER — Encounter: Payer: Medicare Other | Admitting: Internal Medicine

## 2018-11-22 ENCOUNTER — Other Ambulatory Visit: Payer: Self-pay

## 2018-11-22 ENCOUNTER — Telehealth: Payer: Self-pay

## 2018-11-22 ENCOUNTER — Telehealth: Payer: Self-pay | Admitting: Internal Medicine

## 2018-11-22 VITALS — BP 135/60 | HR 106 | Ht 62.0 in | Wt 135.0 lb

## 2018-11-22 DIAGNOSIS — R0602 Shortness of breath: Secondary | ICD-10-CM | POA: Diagnosis not present

## 2018-11-22 DIAGNOSIS — R0789 Other chest pain: Secondary | ICD-10-CM

## 2018-11-22 DIAGNOSIS — I2584 Coronary atherosclerosis due to calcified coronary lesion: Secondary | ICD-10-CM

## 2018-11-22 DIAGNOSIS — I700 Atherosclerosis of aorta: Secondary | ICD-10-CM

## 2018-11-22 DIAGNOSIS — I2510 Atherosclerotic heart disease of native coronary artery without angina pectoris: Secondary | ICD-10-CM | POA: Insufficient documentation

## 2018-11-22 DIAGNOSIS — R079 Chest pain, unspecified: Secondary | ICD-10-CM

## 2018-11-22 MED ORDER — ASPIRIN EC 81 MG PO TBEC: 81.0000 mg | DELAYED_RELEASE_TABLET | Freq: Every day | ORAL | 3 refills | Status: AC

## 2018-11-22 NOTE — Telephone Encounter (Signed)
Copied from [facility] [id number]. Topic: Quick Communication - See Telephone Encounter >> Nov 22, 2018  4:57 PM Sheran Luz wrote: CRM for notification. See Telephone encounter for: 11/22/18.  Patient called stating that she is being treated for pneumonia by Dr. Saunders Revel (cardiologist) in [place] and he suggests that she have both covid-19 test and antibody test.

## 2018-11-22 NOTE — Telephone Encounter (Signed)
Called pt at the request of Dr. Saunders Revel. Pt agrees to in office visit for EKG.   Reviewed procedure for in office appointment, pt agreeable to current standards.  Pt denies fever, cough or exposure to CV 19.

## 2018-11-22 NOTE — Progress Notes (Signed)
This encounter was created in error - please disregard.

## 2018-11-22 NOTE — Progress Notes (Signed)
Virtual Visit via Telephone Note   This visit type was conducted due to national recommendations for restrictions regarding the COVID-19 Pandemic (e.g. social distancing) in an effort to limit this patient's exposure and mitigate transmission in our community.  Due to her co-morbid illnesses, this patient is at least at moderate risk for complications without adequate follow up.  This format is felt to be most appropriate for this patient at this time.  The patient did not have access to video technology/had technical difficulties with video requiring transitioning to audio format only (telephone).  All issues noted in this document were discussed and addressed.  No physical exam could be performed with this format.  Please refer to the patient's chart for her  consent to telehealth for [facility].   Date:  11/22/2018   ID:  Christine Mullins, DOB 12/24/1945, MRN [id number]  Patient Location: Home Provider Location: Office  PCP:  Einar Pheasant, MD  Cardiologist:  New - Emelee Rodocker Electrophysiologist:  None   Evaluation Performed:  Consultation - Christine Mullins was referred by Dr. Patricia Pesa for the evaluation of chest pain.  Chief Complaint:  Shortness of breath and chest pain  History of Present Illness:    Christine Mullins is a 73 y.o. female with history of hypertension, hyperlipidemia, chronic bronchitis with intermittent reactive airway disease, and GERD.  She spoke with Dr. Mortimer Fries last month and noted chest pain with exertion.  Christine Mullins reports shortness of breath, cough, and chest congestion for almost a yearr.  She has been treated with multple courses of antibiotics without resolution.  Initial chest radiograph in 01/2018 showed progressive interstitial prominence throughout both lungs in a reticulonodular pattern.  Findings were suggestive of chronic lung disease though atypical infection was also raised as a possibility.  Repeat chest x-ray in January showed right lower lobe airspace disease  suspicious for pneumonia.  CTA chest in late April showed right lower lobe pneumonia and associated small right pleural effusion.  Multiple ill-defined nodular appearing opacities seen throughout the lungs.  Differential was noted to include multifocal pneumonia, septic emboli, and neoplastic process.  Today, Christine Mullins reports that her breathing has improved over the last 3 weeks following another course of antibiotics, though she continues to have a frequent productive cough.  Coughing seems to worsen her shortness of breath.  She also reports sharp substernal chest pain radiating to the right chest when she takes a deep breath.  The pain only lasts for a second.  She denies exertional chest pain. Christine Mullins denies a history of cardiac disease.  She underwent an exercise tolerance test many years ago while working for the [employer].  She has not experiences lightheadedness, palpitations, orthopnea, PND, and edema.  The patient does have symptoms concerning for COVID-19 infection (fever, chills, cough, or new shortness of breath).  She has not had any recent fevers but is concerned that she may have been exposed to COVID-19 over the winter during a trip to [place].  Patient initially presented to the office for in-person evaluation but was sent home due to possible COVID-19 symptoms and was advised to contact her PCP for further instructions.   Past Medical History:  Diagnosis Date   Arthritis    Depression    GERD (gastroesophageal reflux disease)    Hx of diverticulitis of colon    Hyperlipidemia    Hypertension    Past Surgical History:  Procedure Laterality Date   BREAST BIOPSY Left 1995  negative   CATARACT EXTRACTION, BILATERAL     CESAREAN SECTION     COLONOSCOPY WITH PROPOFOL N/A 05/15/2017   Procedure: COLONOSCOPY WITH PROPOFOL;  Surgeon: Lin Landsman, MD;  Location: [facility] ENDOSCOPY;  Service: Gastroenterology;  Laterality: N/A;   FOOT SURGERY Bilateral     GANGLION CYST EXCISION Left 1965   wrist   GANGLION CYST EXCISION Right 1980   hand   HYSTEROSCOPY W/D&C N/A 04/30/2017   Procedure: DILATATION AND CURETTAGE /HYSTEROSCOPY;  Surgeon: Defrancesco, Alanda Slim, MD;  Location: [facility] ORS;  Service: Gynecology;  Laterality: N/A;   KNEE ARTHROSCOPY Right 2000     Current Meds  Medication Sig   benazepril (LOTENSIN) 10 MG tablet TAKE ONE TABLET EVERY DAY   chlorpheniramine-HYDROcodone (TUSSIONEX PENNKINETIC ER) 10-8 MG/5ML SUER Take 5 mLs by mouth every 4 (four) hours as needed for cough.   escitalopram (LEXAPRO) 10 MG tablet TAKE 1 TABLET BY MOUTH DAILY   guaiFENesin-codeine 100-10 MG/5ML syrup Take 5 mLs by mouth every 4 (four) hours as needed for cough.   hydrochlorothiazide (HYDRODIURIL) 25 MG tablet TAKE ONE TABLET BY MOUTH DAILY   Lancets (ONETOUCH DELICA PLUS [id number]) MISC CHECK BLOOD SUGAR ONCE DAILY AS DIRECTED   omeprazole (PRILOSEC) 40 MG capsule Take 40 mg by mouth daily.    ONETOUCH VERIO test strip CHECK BLOOD SUGAR DAILY AS DIRECTED   simvastatin (ZOCOR) 40 MG tablet Take 40 mg by mouth daily.   zolpidem (AMBIEN) 10 MG tablet TAKE 1/2 TABLET AT BEDTIME AS NEEDED FORSLEEP     Allergies:   Patient has no known allergies.   Social History   Tobacco Use   Smoking status: Never Smoker   Smokeless tobacco: Never Used  Substance Use Topics   Alcohol use: Yes    Comment: social   Drug use: No     Family Hx: The patient's family history includes Arthritis in her mother; Hyperlipidemia in her father and mother; Peripheral Artery Disease in her mother; Stroke in her father. There is no history of Breast cancer, Ovarian cancer, or Colon cancer.  ROS:   Please see the history of present illness.   All other systems reviewed and are negative.   Prior CV studies:   The following studies were reviewed today:  None.  Labs/Other Tests and Data Reviewed:    EKG:  No ECG reviewed.  Recent Labs: 11/23/2017:  BUN 19; Sodium 140 01/02/2018: Potassium 3.5 11/06/2018: Creatinine, Ser 0.80   Recent Lipid Panel Lab Results  Component Value Date/Time   CHOL 158 09/24/2017 09:35 AM   TRIG 73.0 09/24/2017 09:35 AM   HDL 61.90 09/24/2017 09:35 AM   CHOLHDL 3 09/24/2017 09:35 AM   LDLCALC 82 09/24/2017 09:35 AM    Wt Readings from Last 3 Encounters:  11/22/18 135 lb (61.2 kg)  09/09/18 137 lb 6.4 oz (62.3 kg)  07/30/18 140 lb (63.5 kg)     Objective:    Vital Signs:  BP 135/60 (BP Location: Left Arm, Patient Position: Sitting, Cuff Size: Normal)    Pulse (!) 106    Ht 5\' 2"  (1.575 m)    Wt 135 lb (61.2 kg)    SpO2 95%    BMI 24.69 kg/m    VITAL SIGNS:  reviewed  ASSESSMENT & PLAN:    Chest pain: Chest pain has been present for several months and is pleuritic in quality, occurring only with deep inspiration.  She does not endorse any exertional chest pain.  I have personally reviewed  the patient's recent CTA of the chest, which is notable for coronary artery and aortic atherosclerotic calcification.  Ischemia evaluation may need to be considered in the future, particularly if anginal chest pain develops.  However, progressive pulmonary process needs to be explored first.  In the meantime, I have advised Christine Mullins to begin taking ASA 81 mg daily, given her history of hypertension, hyperlipidemia, and aortic/coronary atherosclerosis.  Shortness of breath: Present for almost a year with frequent productive cough.  Persistent pulmonary opacities on CXR and recent chest CTA may represent infectious pneumonitis, but given incomplete response to multiple courses of antibiotics, alternative etiologies such as interstitial pneumonitis and neoplasm must be considered.  Repeat cross sectional imaging, bronchoscopy, and/or biopsy should be considered.  I will reach out to Dr. Mortimer Fries to further discuss plans for workup.  We have agreed to obtain an echocardiogram to exclude structural heart disease that may be  contributing to her symptoms.  I have also asked Christine Mullins to reach out to her PCP to discuss the need for COVID-19 testing.  Coronary artery and aortic atherosclerosis: Calcifications noted in the thoracic aorta and coronary arteries on recent chest CTA.  We will begin ASA 81 mg daily and continue current blood pressure and lipid regimens to prevent progression of disease.  COVID-19 Education: The signs and symptoms of COVID-19 were discussed with the patient and how to seek care for testing (follow up with PCP or arrange E-visit).  The importance of social distancing was discussed today.  Time:   Today, I have spent 25 minutes with the patient with telehealth technology discussing the above problems.  An additional 20 minutes were spent reviewing the patient's chart and documenting today's encounter.   Medication Adjustments/Labs and Tests Ordered: Current medicines are reviewed at length with the patient today.  Concerns regarding medicines are outlined above.   Tests Ordered: Orders Placed This Encounter  Procedures   ECHOCARDIOGRAM COMPLETE    Medication Changes: Meds ordered this encounter  Medications   aspirin EC 81 MG tablet    Sig: Take 1 tablet (81 mg total) by mouth daily.    Dispense:  90 tablet    Refill:  3    Disposition:  Follow up 3-4 weeks.  Signed, Nelva Bush, MD  11/22/2018 4:15 PM    [facility]

## 2018-11-22 NOTE — Patient Instructions (Signed)
Medication Instructions:  Your physician has recommended you make the following change in your medication:   START Aspirin 81mg  daily.   If you need a refill on your cardiac medications before your next appointment, please call your pharmacy.   Lab work: None ordered If you have labs (blood work) drawn today and your tests are completely normal, you will receive your results only by: Marland Kitchen MyChart Message (if you have MyChart) OR . A paper copy in the mail If you have any lab test that is abnormal or we need to change your treatment, we will call you to review the results.  Testing/Procedures: Your physician has requested that you have an echocardiogram. Echocardiography is a painless test that uses sound waves to create images of your heart. It provides your doctor with information about the size and shape of your heart and how well your heart's chambers and valves are working. This procedure takes approximately one hour. There are no restrictions for this procedure. ( Can be completed after follow up with primary care regarding possible COVID-19 work up)  Follow-Up: At [facility], you and your health needs are our priority.  As part of our continuing mission to provide you with exceptional heart care, we have created designated Provider Care Teams.  These Care Teams include your primary Cardiologist (physician) and Advanced Practice Providers (APPs -  Physician Assistants and Nurse Practitioners) who all work together to provide you with the care you need, when you need it. You will need a follow up appointment in 3-4 weeks.  You may see Dr.End or one of the following Advanced Practice Providers on your designated Care Team:   Murray Hodgkins, NP Christell Faith, PA-C . Marrianne Mood, PA-C  Any Other Special Instructions Will Be Listed Below (If Applicable). Please contact your primary care physician regarding screening and possible work up for COVID-19

## 2018-11-22 NOTE — Telephone Encounter (Signed)
-----   Message from Nelva Bush, MD sent at 11/22/2018  7:40 AM EDT ----- Regarding: New patient visit Good morning,It appears that Ms. Christine Mullins was scheduled last week for a new virtual patient visit.  In looking over her chart today, I think it would be most appropriate for her to be seen in person for evaluation of her chest pain, as she has not had an EKG in over 5 years.  If possible, please try to switch her visit today to in person.  Thanks.  Gerald Stabs

## 2018-11-25 ENCOUNTER — Telehealth: Payer: Self-pay | Admitting: Internal Medicine

## 2018-11-25 ENCOUNTER — Other Ambulatory Visit: Payer: Self-pay | Admitting: Internal Medicine

## 2018-11-25 ENCOUNTER — Telehealth: Payer: Self-pay

## 2018-11-25 ENCOUNTER — Other Ambulatory Visit: Payer: Medicare Other

## 2018-11-25 DIAGNOSIS — Z20822 Contact with and (suspected) exposure to covid-19: Secondary | ICD-10-CM

## 2018-11-25 DIAGNOSIS — R6889 Other general symptoms and signs: Secondary | ICD-10-CM | POA: Diagnosis not present

## 2018-11-25 DIAGNOSIS — Z20828 Contact with and (suspected) exposure to other viral communicable diseases: Secondary | ICD-10-CM

## 2018-11-25 NOTE — Telephone Encounter (Signed)
Antibody test ordered.

## 2018-11-25 NOTE — Telephone Encounter (Signed)
Message sent to provider to order tests if agreeable with the cardiologist.  Lesia Hausen

## 2018-11-25 NOTE — Telephone Encounter (Signed)
Spoke to patient, states she is getting better, although still has coughing spasms 2-3 times a day, once in am, once in pm. Patient is requesting more Tussinex cough syrup, she has Delsym at home, but states she still coughs with this.   Dr. Mortimer Fries, please advise on refill of Tussinex.

## 2018-11-25 NOTE — Telephone Encounter (Signed)
Called nurse to give info for testing. She is going to reach out to pt and schedule drive thru test.

## 2018-11-25 NOTE — Telephone Encounter (Signed)
Christine Mullins from Dr. Bary Leriche office calling to request COVID 19 testing fpr pt.

## 2018-11-25 NOTE — Telephone Encounter (Signed)
Order for antibody test placed

## 2018-11-25 NOTE — Telephone Encounter (Signed)
Can swab her for acute infection.  You have the protocol.  Pt 73 year old and being treated for persistent respiratory symptoms.  Cardiology recommended her to be tested.  You can give her the information for antibody testing.

## 2018-11-26 ENCOUNTER — Other Ambulatory Visit: Payer: Self-pay | Admitting: Internal Medicine

## 2018-11-26 DIAGNOSIS — R05 Cough: Secondary | ICD-10-CM

## 2018-11-26 DIAGNOSIS — R059 Cough, unspecified: Secondary | ICD-10-CM

## 2018-11-26 DIAGNOSIS — Z20828 Contact with and (suspected) exposure to other viral communicable diseases: Secondary | ICD-10-CM | POA: Diagnosis not present

## 2018-11-26 MED ORDER — HYDROCOD POLST-CPM POLST ER 10-8 MG/5ML PO SUER: 5.0000 mL | ORAL | 0 refills | Status: DC | PRN

## 2018-11-26 NOTE — Telephone Encounter (Signed)
Please refill.

## 2018-11-26 NOTE — Telephone Encounter (Signed)
LM on VM (per DPR) that medication has been refilled and sent to [facility].

## 2018-11-26 NOTE — Telephone Encounter (Signed)
[facility] called stating that they cannot fill the Tussinex they way it was prescribed. Must be every 12 hours as needed, not every 4 hours. They have voided original prescription and will await a new prescription.

## 2018-11-26 NOTE — Telephone Encounter (Signed)
I am unable to refill, it must be MD and have fingerprint.

## 2018-11-26 NOTE — Telephone Encounter (Signed)
Refilled every 12

## 2018-11-27 LAB — SAR COV2 SEROLOGY (COVID19)AB(IGG),IA: SARS-CoV-2 Ab, IgG: NEGATIVE

## 2018-11-28 LAB — NOVEL CORONAVIRUS, NAA: SARS-CoV-2, NAA: NOT DETECTED

## 2018-11-29 ENCOUNTER — Telehealth: Payer: Self-pay

## 2018-11-29 NOTE — Telephone Encounter (Signed)
Patient needs an echo

## 2018-11-29 NOTE — Telephone Encounter (Signed)
Copied from [place] [id number]. Topic: General - Inquiry >> Nov 27, 2018  3:56 PM Jeri Cos wrote: Reason for CRM: Pt called stating someone from the office called her but her phone didn't accept the call for some reason. She is requesting a call back.   Called and spoke to pt and made her aware that the LPN was calling to let her know her medication was refilled and the antibody test was ordered and she states she went and had the test done.  Chanese Hartsough,cma

## 2018-12-02 ENCOUNTER — Other Ambulatory Visit: Payer: Self-pay | Admitting: Internal Medicine

## 2018-12-04 ENCOUNTER — Telehealth: Payer: Self-pay | Admitting: Internal Medicine

## 2018-12-04 NOTE — Telephone Encounter (Signed)
No answer. Left detail message with recommendations, ok per DPR, and to call back if any questions.

## 2018-12-04 NOTE — Telephone Encounter (Signed)
Patient had an e-visit on 11/22/18 with Dr. Saunders Revel.  To Dr. Saunders Revel to review.

## 2018-12-04 NOTE — Telephone Encounter (Signed)
I will defer this to Dr. Mortimer Fries or the patient's PCP, as I do not believe that her shortness of breath is a primary cardiac issue.  Nelva Bush, MD [facility] Pager: [phone]

## 2018-12-04 NOTE — Telephone Encounter (Signed)
Patient would like Dr. Saunders Revel to approve handicapped sticker due to sob on exertion   Please call to advise   See also note with Dr. Mortimer Fries

## 2018-12-04 NOTE — Telephone Encounter (Signed)
Called and spoke to pt, who is requesting a handicap parking permit.  DK please advise. Thanks

## 2018-12-05 NOTE — Telephone Encounter (Signed)
I need to assess this I do NOT think patient meets criteria for handicap She does NOT require oxygen

## 2018-12-05 NOTE — Telephone Encounter (Signed)
Left message for pt to relay below message.

## 2018-12-05 NOTE — Telephone Encounter (Signed)
Spoke to pt and relayed below message.  Pt stated that she is unable to walk into a store without stopping to rest, due to sob.   DK please advise. Thanks

## 2018-12-06 NOTE — Telephone Encounter (Signed)
ATC pt- no answer with no option to leave voicemail. Line rang for >58min.  Will call back.

## 2018-12-06 NOTE — Telephone Encounter (Signed)
She will need 6MWT to assess for hypoxia

## 2018-12-10 NOTE — Telephone Encounter (Signed)
ATC pt- unable to leave voicemail. Received recording that [employer] needed additional information.   Will call back.

## 2018-12-10 NOTE — Telephone Encounter (Signed)
Pt returning call.Christine Mullins ° °

## 2018-12-11 ENCOUNTER — Ambulatory Visit: Payer: Medicare Other

## 2018-12-11 NOTE — Telephone Encounter (Signed)
SMW scheduled for 12/18/2018. Nothing further is needed.

## 2018-12-17 ENCOUNTER — Ambulatory Visit: Payer: Medicare Other

## 2018-12-18 ENCOUNTER — Other Ambulatory Visit: Payer: Self-pay

## 2018-12-18 ENCOUNTER — Ambulatory Visit (INDEPENDENT_AMBULATORY_CARE_PROVIDER_SITE_OTHER): Payer: Medicare Other

## 2018-12-18 DIAGNOSIS — R0602 Shortness of breath: Secondary | ICD-10-CM | POA: Diagnosis not present

## 2018-12-18 DIAGNOSIS — R059 Cough, unspecified: Secondary | ICD-10-CM

## 2018-12-18 DIAGNOSIS — R05 Cough: Secondary | ICD-10-CM

## 2018-12-18 NOTE — Progress Notes (Signed)
SIX MIN WALK 12/18/2018  Medications Lotensin 10 mg, lexapro 10 mg, prilosec 40 mg  Supplimental Oxygen during Test? (L/min) No  Laps 7  Baseline BP (sitting) 128/68  Baseline Heartrate 100  Baseline Dyspnea (Borg Scale) 3  Baseline Fatigue (Borg Scale) 3  Baseline SPO2 93  BP (sitting) 122/72  Heartrate 115  Dyspnea (Borg Scale) 5  Fatigue (Borg Scale) 5  SPO2 89  BP (sitting) 120/70  Heartrate 104  SPO2 94  Stopped or Paused before Six Minutes No  Tech Comments: Patient walked at quick pace for the whole 6 minutes. During much of the time she was breathing hard, though she did not stop. Oxygen saturation did not drop below 89%.    Also during this walk, patient had many questions she wanted to discuss with Dr. Mortimer Fries. She states she has many episodes during the day where she coughs and gets short of breath. She stated about two days ago she had a sharp pain across her lower right ribs. She stated she has had two collapsed lungs before. Sometimes it is hard for her to catch her breath and has a pain in her chest. She is still coughing out tons of mucus, "barely yellowish" in color. She also notes some wheezing at night when she lays down.

## 2018-12-20 ENCOUNTER — Other Ambulatory Visit: Payer: Self-pay

## 2018-12-20 ENCOUNTER — Telehealth: Payer: Medicare Other | Admitting: Internal Medicine

## 2018-12-20 ENCOUNTER — Telehealth: Payer: Self-pay | Admitting: Internal Medicine

## 2018-12-20 DIAGNOSIS — R05 Cough: Secondary | ICD-10-CM

## 2018-12-20 DIAGNOSIS — R059 Cough, unspecified: Secondary | ICD-10-CM

## 2018-12-20 NOTE — Telephone Encounter (Signed)
Called and spoke to patient. I let her know of Dr. Zoila Shutter recommendations for an incentive spirometer and flutter valve. Also, he wants her to go ahead and get a repeat CT scan of the chest. She is aware we will call her to schedule this.   Handicapped application has been filled out and patient will pick up today.   Patient also questioned a refill of cough medicine. Will send to Dr. Mortimer Fries for prescription.

## 2018-12-20 NOTE — Telephone Encounter (Signed)
We NO longer prescribe narcotic based medications Patient can get robitussin DM OTC, or we can prescribe 5 ml every 6 hrs as needed for cough

## 2018-12-20 NOTE — Telephone Encounter (Signed)
Pt is aware of below of recommendations and voiced her understanding.  Nothing further is needed.

## 2018-12-23 ENCOUNTER — Other Ambulatory Visit: Payer: Self-pay | Admitting: Internal Medicine

## 2018-12-24 ENCOUNTER — Ambulatory Visit (INDEPENDENT_AMBULATORY_CARE_PROVIDER_SITE_OTHER): Payer: Medicare Other

## 2018-12-24 ENCOUNTER — Other Ambulatory Visit: Payer: Self-pay

## 2018-12-24 DIAGNOSIS — R0602 Shortness of breath: Secondary | ICD-10-CM

## 2018-12-24 NOTE — Progress Notes (Signed)
Virtual Visit via Video Note   This visit type was conducted due to national recommendations for restrictions regarding the COVID-19 Pandemic (e.g. social distancing) in an effort to limit this patient's exposure and mitigate transmission in our community.  Due to her co-morbid illnesses, this patient is at least at moderate risk for complications without adequate follow up.  This format is felt to be most appropriate for this patient at this time.  All issues noted in this document were discussed and addressed.  A limited physical exam was performed with this format.  Please refer to the patient's chart for her consent to telehealth for [facility].   Date:  12/25/2018   ID:  Christine Mullins, DOB 05-02-1946, MRN [id number]  Patient Location: Home Provider Location: Office  PCP:  Einar Pheasant, MD  Cardiologist:  Nelva Bush, MD  Electrophysiologist:  None   Evaluation Performed:  Follow-Up Visit  Chief Complaint:  Shortness of breath  History of Present Illness:    Christine Mullins is a 73 y.o. female with history of hypertension, hyperlipidemia, chronic bronchitis with intermittent reactive airway disease, and GERD.  I met her a month ago for evaluation of progressive shortness of breath.  We agreed to perform a transthoracic echocardiogram, which was done yesterday and showed hyperdynamic LVEF with grade 1 diastolic dysfunction and no significant valvular abnormality.  Today, Christine Mullins that her breathing seems to be getting worse.  She has exertional dyspnea when walking a few feet and at times seems to gasp for air.  She continues to cough frequently with mucous production.  She is currently without a cough suppressant and has been using zolpidem more frequently to help her sleep at night.  She notes occasional right-sided chest pain with coughing or taking a deep breath.  The pain lasts less than 1-2 seconds.  She denies palpitations, lightheadedness, orthopnea, and edema.   She is scheduled for PFT's and repeat chest CT next week (ordered by Dr. Mortimer Fries).  The patient does not have symptoms concerning for COVID-19 infection (fever, chills, cough, or new shortness of breath).    Past Medical History:  Diagnosis Date   Arthritis    Depression    GERD (gastroesophageal reflux disease)    Hx of diverticulitis of colon    Hyperlipidemia    Hypertension    Past Surgical History:  Procedure Laterality Date   BREAST BIOPSY Left 1995   negative   CATARACT EXTRACTION, BILATERAL     CESAREAN SECTION     COLONOSCOPY WITH PROPOFOL N/A 05/15/2017   Procedure: COLONOSCOPY WITH PROPOFOL;  Surgeon: Lin Landsman, MD;  Location: [facility] ENDOSCOPY;  Service: Gastroenterology;  Laterality: N/A;   FOOT SURGERY Bilateral    GANGLION CYST EXCISION Left 1965   wrist   GANGLION CYST EXCISION Right 1980   hand   HYSTEROSCOPY W/D&C N/A 04/30/2017   Procedure: DILATATION AND CURETTAGE /HYSTEROSCOPY;  Surgeon: Defrancesco, Alanda Slim, MD;  Location: [facility] ORS;  Service: Gynecology;  Laterality: N/A;   KNEE ARTHROSCOPY Right 2000     Current Meds  Medication Sig   aspirin EC 81 MG tablet Take 1 tablet (81 mg total) by mouth daily.   benazepril (LOTENSIN) 10 MG tablet TAKE 1 TABLET BY MOUTH DAILY   escitalopram (LEXAPRO) 10 MG tablet TAKE 1 TABLET BY MOUTH DAILY   hydrochlorothiazide (HYDRODIURIL) 25 MG tablet TAKE ONE TABLET BY MOUTH DAILY   Lancets (ONETOUCH DELICA PLUS [id number]) MISC CHECK BLOOD SUGAR ONCE DAILY AS  DIRECTED   omeprazole (PRILOSEC) 40 MG capsule Take 40 mg by mouth daily.    ONETOUCH VERIO test strip CHECK BLOOD SUGAR DAILY AS DIRECTED   simvastatin (ZOCOR) 40 MG tablet Take 40 mg by mouth daily.   zolpidem (AMBIEN) 10 MG tablet TAKE 1/2 TABLET BY MOUTH AT BEDTIME AS NEEDED FOR SLEEP   [DISCONTINUED] chlorpheniramine-HYDROcodone (TUSSIONEX PENNKINETIC ER) 10-8 MG/5ML SUER Take 5 mLs by mouth every 4 (four) hours as needed for cough.     [DISCONTINUED] chlorpheniramine-HYDROcodone (TUSSIONEX) 10-8 MG/5ML SUER TAKE 5MLS EVERY 12 HOURS AS NEEDED FOR COUGH   [DISCONTINUED] guaiFENesin-codeine 100-10 MG/5ML syrup Take 5 mLs by mouth every 4 (four) hours as needed for cough.     Allergies:   Patient has no known allergies.   Social History   Tobacco Use   Smoking status: Never Smoker   Smokeless tobacco: Never Used  Substance Use Topics   Alcohol use: Yes    Comment: social   Drug use: No     Family Hx: The patient's family history includes Arthritis in her mother; Hyperlipidemia in her father and mother; Peripheral Artery Disease in her mother; Stroke in her father. There is no history of Breast cancer, Ovarian cancer, or Colon cancer.  ROS:   Please see the history of present illness.   All other systems reviewed and are negative.   Prior CV studies:   The following studies were reviewed today:  Echocardiogram (12/24/2018): Normal LV size with hyperdynamic systolic function (EF greater than 65%).  Mild LVH with grade 1 diastolic dysfunction.  No regional wall motion abnormality.  Normal RV size and function.  Normal PA pressure.  Labs/Other Tests and Data Reviewed:    EKG:  No ECG reviewed.  Recent Labs: 01/02/2018: Potassium 3.5 11/06/2018: Creatinine, Ser 0.80   Recent Lipid Panel Lab Results  Component Value Date/Time   CHOL 158 09/24/2017 09:35 AM   TRIG 73.0 09/24/2017 09:35 AM   HDL 61.90 09/24/2017 09:35 AM   CHOLHDL 3 09/24/2017 09:35 AM   LDLCALC 82 09/24/2017 09:35 AM    Wt Readings from Last 3 Encounters:  12/25/18 135 lb (61.2 kg)  11/22/18 135 lb (61.2 kg)  09/09/18 137 lb 6.4 oz (62.3 kg)     Objective:    Vital Signs:  BP 130/89 (BP Location: Left Arm, Patient Position: Sitting, Cuff Size: Normal)    Pulse (!) 103    Ht '5\' 2"'  (1.575 m)    Wt 135 lb (61.2 kg)    BMI 24.69 kg/m    VITAL SIGNS:  reviewed GEN:  no acute distress  ASSESSMENT & PLAN:    Shortness of breath  and cough: I do not believe that dyspnea and chronic cough with mucous production are primarily cardiac in nature.  Brief right-sided chest pain with coughing/deep inspiration is not consistent with angina either.  Yesterday's echo did not reveal any significant structural abnormalities to explain the patient's progressive shortness of breath.  I suspect a pulmonary process underlies her symptoms.  Given lack of resolution with multiple courses of antibiotics, atypical infection or interstitial pneumonitis should be considered.  I agree with upcoming PFT's and repeat chest CT.  If both of these studies are unremarkable, we may need to consider right and left heart catheterization to exclude cardiac cause of her dyspnea.  However, we will defer this for now, as my clinical suspicion for obstructive CAD and/or cardiomyopathy/pulmonary hypertension is low based on the constellation of symptoms and echo findings.  Coronary artery calcification and aortic atherosclerosis: Continue aspirin and statin therapy.  As above, we will defer ischemia evaluation pending pulmonary testing.  COVID-19 Education: The signs and symptoms of COVID-19 were discussed with the patient and how to seek care for testing (follow up with PCP or arrange E-visit).  The importance of social distancing was discussed today.  Time:   Today, I have spent 15 minutes with the patient with telehealth technology discussing the above problems.     Medication Adjustments/Labs and Tests Ordered: Current medicines are reviewed at length with the patient today.  Concerns regarding medicines are outlined above.   Tests Ordered: None.  Medication Changes: None  Follow Up:  Virtual Visit or In Person in 3 month(s)  Signed, Nelva Bush, MD  12/25/2018 12:23 PM    [facility]

## 2018-12-24 NOTE — Telephone Encounter (Signed)
Refilled: 09/18/2018 Last OV: 01/28/2018 Next OV: 02/07/2019

## 2018-12-25 ENCOUNTER — Telehealth (INDEPENDENT_AMBULATORY_CARE_PROVIDER_SITE_OTHER): Payer: Medicare Other | Admitting: Internal Medicine

## 2018-12-25 ENCOUNTER — Other Ambulatory Visit: Payer: Self-pay

## 2018-12-25 ENCOUNTER — Encounter: Payer: Self-pay | Admitting: Internal Medicine

## 2018-12-25 VITALS — BP 130/89 | HR 103 | Ht 62.0 in | Wt 135.0 lb

## 2018-12-25 DIAGNOSIS — R0602 Shortness of breath: Secondary | ICD-10-CM | POA: Diagnosis not present

## 2018-12-25 DIAGNOSIS — R059 Cough, unspecified: Secondary | ICD-10-CM

## 2018-12-25 DIAGNOSIS — R05 Cough: Secondary | ICD-10-CM

## 2018-12-25 DIAGNOSIS — I2510 Atherosclerotic heart disease of native coronary artery without angina pectoris: Secondary | ICD-10-CM

## 2018-12-25 DIAGNOSIS — I700 Atherosclerosis of aorta: Secondary | ICD-10-CM

## 2018-12-25 NOTE — Patient Instructions (Signed)
Medication Instructions:  Your physician recommends that you continue on your current medications as directed. Please refer to the Current Medication list given to you today.  If you need a refill on your cardiac medications before your next appointment, please call your pharmacy.   Lab work: - None ordered.  If you have labs (blood work) drawn today and your tests are completely normal, you will receive your results only by: Marland Kitchen MyChart Message (if you have MyChart) OR . A paper copy in the mail If you have any lab test that is abnormal or we need to change your treatment, we will call you to review the results.  Testing/Procedures: - None ordered.   Follow-Up: At [facility], you and your health needs are our priority.  As part of our continuing mission to provide you with exceptional heart care, we have created designated Provider Care Teams.  These Care Teams include your primary Cardiologist (physician) and Advanced Practice Providers (APPs -  Physician Assistants and Nurse Practitioners) who all work together to provide you with the care you need, when you need it. You will need a follow up appointment in 3 months (ideally IN OFFICE).  Please call our office 2 months in advance to schedule this appointment.  You may see Nelva Bush, MD or one of the following Advanced Practice Providers on your designated Care Team:   Murray Hodgkins, NP Christell Faith, PA-C . Marrianne Mood, PA-C

## 2018-12-25 NOTE — Telephone Encounter (Signed)
rx ok'd for ambien #30 with no refills.   

## 2018-12-26 ENCOUNTER — Ambulatory Visit: Payer: Medicare Other | Admitting: Internal Medicine

## 2018-12-27 ENCOUNTER — Other Ambulatory Visit: Payer: Self-pay

## 2018-12-27 ENCOUNTER — Other Ambulatory Visit
Admission: RE | Admit: 2018-12-27 | Discharge: 2018-12-27 | Disposition: A | Discharge status: Home / Self Care | Payer: Medicare Other | Source: Ambulatory Visit | Attending: Internal Medicine | Admitting: Internal Medicine

## 2018-12-27 DIAGNOSIS — Z1159 Encounter for screening for other viral diseases: Secondary | ICD-10-CM | POA: Insufficient documentation

## 2018-12-28 LAB — NOVEL CORONAVIRUS, NAA (HOSP ORDER, SEND-OUT TO REF LAB; TAT 18-24 HRS): SARS-CoV-2, NAA: NOT DETECTED

## 2018-12-31 ENCOUNTER — Ambulatory Visit
Admission: RE | Admit: 2018-12-31 | Discharge: 2018-12-31 | Disposition: A | Discharge status: Home / Self Care | Payer: Medicare Other | Source: Ambulatory Visit | Attending: Internal Medicine | Admitting: Internal Medicine

## 2018-12-31 ENCOUNTER — Other Ambulatory Visit: Payer: Self-pay

## 2018-12-31 DIAGNOSIS — R0602 Shortness of breath: Secondary | ICD-10-CM | POA: Diagnosis not present

## 2018-12-31 DIAGNOSIS — J181 Lobar pneumonia, unspecified organism: Secondary | ICD-10-CM | POA: Insufficient documentation

## 2019-01-01 ENCOUNTER — Ambulatory Visit: Payer: Medicare Other | Attending: Internal Medicine

## 2019-01-01 ENCOUNTER — Telehealth: Payer: Self-pay | Admitting: Internal Medicine

## 2019-01-01 DIAGNOSIS — J4520 Mild intermittent asthma, uncomplicated: Secondary | ICD-10-CM | POA: Diagnosis not present

## 2019-01-01 DIAGNOSIS — R0602 Shortness of breath: Secondary | ICD-10-CM

## 2019-01-01 NOTE — Telephone Encounter (Signed)
Left message for pt

## 2019-01-01 NOTE — Progress Notes (Signed)
Christine Mullins was seen in the pulmonary lab on Wednesday, 01/01/19 for her pulmonary function test. Patient was unable to complete testing due to increased WOB and repetitive coughing with exertion. Patient was only able to speak one to two words at time, oxygen saturation ranged from 90-92% on room air. An attempt was made to call office to notify MD covering for Dr. Mortimer Fries, since he is on vacation, but office already closed. Dr. Alva Garnet, was working as intensivist this week in ICU, call placed to him to notify of patients lack of ability to do testing. After filling Dr. Alva Garnet in on patients situation and history, he had me relay message to patient to call office in the morning to obtain the first available appointment with Dr. Mortimer Fries to be evaluated, and if it will be a wait, Dr. Alva Garnet stated would see her. Patient stated she did not want to be seen in ER,and that this is has been her normal for a few months.

## 2019-01-02 ENCOUNTER — Other Ambulatory Visit: Payer: Self-pay

## 2019-01-02 ENCOUNTER — Ambulatory Visit: Payer: Medicare Other

## 2019-01-02 ENCOUNTER — Telehealth: Payer: Self-pay

## 2019-01-02 NOTE — Telephone Encounter (Signed)
Regular Bronchoscopy  Dx: recurrent pneumonia CPT: 51834 Date: 01/07/19

## 2019-01-02 NOTE — Telephone Encounter (Signed)
No PA required with [facility] and [facility] Rainbow for this procedure code [phone]. Rhonda J Cobb

## 2019-01-02 NOTE — Telephone Encounter (Signed)
Patient is returning phone call.  States needs an appt today.  Patient phone number is [phone].

## 2019-01-02 NOTE — Telephone Encounter (Signed)
Called and spoke to patient. Per Dr. Alva Garnet, we put her in with Dr. Alva Garnet Monday with CXR before, plan for bronch Tuesday. Will fax over [place] request. Patient is aware to go for covid testing tomorrow. States the lowest her O2 has gotten is 89% and she is constantly keeping an eye on it. She is aware to call us if she gets worse.

## 2019-01-03 ENCOUNTER — Other Ambulatory Visit
Admission: RE | Admit: 2019-01-03 | Discharge: 2019-01-03 | Disposition: A | Discharge status: Home / Self Care | Payer: Medicare Other | Source: Ambulatory Visit | Attending: Pulmonary Disease | Admitting: Pulmonary Disease

## 2019-01-03 ENCOUNTER — Telehealth: Payer: Self-pay | Admitting: Pulmonary Disease

## 2019-01-03 ENCOUNTER — Other Ambulatory Visit: Payer: Self-pay

## 2019-01-03 DIAGNOSIS — Z1159 Encounter for screening for other viral diseases: Secondary | ICD-10-CM | POA: Insufficient documentation

## 2019-01-03 NOTE — Telephone Encounter (Signed)
Pt is currently awaiting covid testing for bronch.  Pt had covid testing on 12/27/2018, which was negative. Per Erasmo Downer and I, okay to proceed with appt at this time.  Will follow up on results on Monday, as pt was just tested today.

## 2019-01-03 NOTE — Telephone Encounter (Signed)
Called patient for COVID-19 pre-screening for in office visit.  Have you recently traveled any where out of the local area in the last 2 weeks? No  Have you been in close contact with a person diagnosed with COVID-19 or someone awaiting results within the last 2 weeks? Yes- pt waiting for COVID results)  Do you currently have any of the following symptoms? If so, when did they start? Cough (Yes)    Diarrhea  Joint Pain Fever      Muscle Pain   Red eyes Shortness of breath (Yes)   Abdominal pain Vomiting Loss of smell    Rash    Sore Throat Headache    Weakness   Bruising or bleeding  Pt states she has pneumonia   Okay to proceed with visit. (date)  / Needs to reschedule visit. (date)

## 2019-01-04 LAB — NOVEL CORONAVIRUS, NAA (HOSP ORDER, SEND-OUT TO REF LAB; TAT 18-24 HRS): SARS-CoV-2, NAA: NOT DETECTED

## 2019-01-06 ENCOUNTER — Ambulatory Visit
Admission: RE | Admit: 2019-01-06 | Discharge: 2019-01-06 | Disposition: A | Discharge status: Home / Self Care | Payer: Medicare Other | Source: Ambulatory Visit | Attending: Pulmonary Disease | Admitting: Pulmonary Disease

## 2019-01-06 ENCOUNTER — Other Ambulatory Visit: Payer: Self-pay

## 2019-01-06 ENCOUNTER — Inpatient Hospital Stay
Admission: EM | Admit: 2019-01-06 | Discharge: 2019-01-09 | DRG: 166 | Disposition: A | Discharge status: Home / Self Care | Payer: Medicare Other | Attending: Internal Medicine | Admitting: Internal Medicine

## 2019-01-06 ENCOUNTER — Encounter: Payer: Self-pay | Admitting: Pulmonary Disease

## 2019-01-06 ENCOUNTER — Ambulatory Visit (INDEPENDENT_AMBULATORY_CARE_PROVIDER_SITE_OTHER): Payer: Medicare Other | Admitting: Pulmonary Disease

## 2019-01-06 ENCOUNTER — Encounter: Payer: Self-pay | Admitting: Emergency Medicine

## 2019-01-06 VITALS — BP 98/52 | HR 97 | Temp 98.2°F | Ht 62.0 in | Wt 143.8 lb

## 2019-01-06 DIAGNOSIS — J9600 Acute respiratory failure, unspecified whether with hypoxia or hypercapnia: Secondary | ICD-10-CM | POA: Diagnosis not present

## 2019-01-06 DIAGNOSIS — I2584 Coronary atherosclerosis due to calcified coronary lesion: Secondary | ICD-10-CM

## 2019-01-06 DIAGNOSIS — J181 Lobar pneumonia, unspecified organism: Secondary | ICD-10-CM

## 2019-01-06 DIAGNOSIS — J189 Pneumonia, unspecified organism: Secondary | ICD-10-CM | POA: Diagnosis not present

## 2019-01-06 DIAGNOSIS — R0602 Shortness of breath: Secondary | ICD-10-CM | POA: Diagnosis not present

## 2019-01-06 DIAGNOSIS — E876 Hypokalemia: Secondary | ICD-10-CM | POA: Diagnosis not present

## 2019-01-06 DIAGNOSIS — C3492 Malignant neoplasm of unspecified part of left bronchus or lung: Secondary | ICD-10-CM | POA: Diagnosis not present

## 2019-01-06 DIAGNOSIS — Z87891 Personal history of nicotine dependence: Secondary | ICD-10-CM

## 2019-01-06 DIAGNOSIS — Z823 Family history of stroke: Secondary | ICD-10-CM

## 2019-01-06 DIAGNOSIS — J9621 Acute and chronic respiratory failure with hypoxia: Secondary | ICD-10-CM | POA: Diagnosis present

## 2019-01-06 DIAGNOSIS — Z8249 Family history of ischemic heart disease and other diseases of the circulatory system: Secondary | ICD-10-CM

## 2019-01-06 DIAGNOSIS — C3480 Malignant neoplasm of overlapping sites of unspecified bronchus and lung: Secondary | ICD-10-CM | POA: Diagnosis not present

## 2019-01-06 DIAGNOSIS — F329 Major depressive disorder, single episode, unspecified: Secondary | ICD-10-CM | POA: Diagnosis present

## 2019-01-06 DIAGNOSIS — E7800 Pure hypercholesterolemia, unspecified: Secondary | ICD-10-CM | POA: Diagnosis present

## 2019-01-06 DIAGNOSIS — J4520 Mild intermittent asthma, uncomplicated: Secondary | ICD-10-CM | POA: Diagnosis not present

## 2019-01-06 DIAGNOSIS — C342 Malignant neoplasm of middle lobe, bronchus or lung: Principal | ICD-10-CM | POA: Diagnosis present

## 2019-01-06 DIAGNOSIS — I10 Essential (primary) hypertension: Secondary | ICD-10-CM | POA: Diagnosis present

## 2019-01-06 DIAGNOSIS — M1990 Unspecified osteoarthritis, unspecified site: Secondary | ICD-10-CM | POA: Diagnosis present

## 2019-01-06 DIAGNOSIS — I2510 Atherosclerotic heart disease of native coronary artery without angina pectoris: Secondary | ICD-10-CM

## 2019-01-06 DIAGNOSIS — Z8261 Family history of arthritis: Secondary | ICD-10-CM

## 2019-01-06 DIAGNOSIS — Z8349 Family history of other endocrine, nutritional and metabolic diseases: Secondary | ICD-10-CM | POA: Diagnosis not present

## 2019-01-06 DIAGNOSIS — K219 Gastro-esophageal reflux disease without esophagitis: Secondary | ICD-10-CM | POA: Diagnosis present

## 2019-01-06 DIAGNOSIS — Z7982 Long term (current) use of aspirin: Secondary | ICD-10-CM | POA: Diagnosis not present

## 2019-01-06 DIAGNOSIS — R918 Other nonspecific abnormal finding of lung field: Secondary | ICD-10-CM | POA: Diagnosis not present

## 2019-01-06 DIAGNOSIS — I700 Atherosclerosis of aorta: Secondary | ICD-10-CM | POA: Diagnosis present

## 2019-01-06 DIAGNOSIS — E785 Hyperlipidemia, unspecified: Secondary | ICD-10-CM | POA: Diagnosis present

## 2019-01-06 DIAGNOSIS — J9601 Acute respiratory failure with hypoxia: Secondary | ICD-10-CM | POA: Diagnosis not present

## 2019-01-06 DIAGNOSIS — Z9889 Other specified postprocedural states: Secondary | ICD-10-CM

## 2019-01-06 DIAGNOSIS — C3491 Malignant neoplasm of unspecified part of right bronchus or lung: Secondary | ICD-10-CM | POA: Diagnosis not present

## 2019-01-06 LAB — CBC WITH DIFFERENTIAL/PLATELET
Abs Immature Granulocytes: 0.05 10*3/uL (ref 0.00–0.07)
Basophils Absolute: 0.1 10*3/uL (ref 0.0–0.1)
Basophils Relative: 1 %
Eosinophils Absolute: 0.1 10*3/uL (ref 0.0–0.5)
Eosinophils Relative: 1 %
HCT: 42.3 % (ref 36.0–46.0)
Hemoglobin: 14.6 g/dL (ref 12.0–15.0)
Immature Granulocytes: 1 %
Lymphocytes Relative: 15 %
Lymphs Abs: 1.6 10*3/uL (ref 0.7–4.0)
MCH: 29.7 pg (ref 26.0–34.0)
MCHC: 34.5 g/dL (ref 30.0–36.0)
MCV: 86 fL (ref 80.0–100.0)
Monocytes Absolute: 1.2 10*3/uL — ABNORMAL HIGH (ref 0.1–1.0)
Monocytes Relative: 12 %
Neutro Abs: 7.6 10*3/uL (ref 1.7–7.7)
Neutrophils Relative %: 70 %
Platelets: 330 10*3/uL (ref 150–400)
RBC: 4.92 MIL/uL (ref 3.87–5.11)
RDW: 13.4 % (ref 11.5–15.5)
WBC: 10.7 10*3/uL — ABNORMAL HIGH (ref 4.0–10.5)
nRBC: 0 % (ref 0.0–0.2)

## 2019-01-06 LAB — COMPREHENSIVE METABOLIC PANEL
ALT: 12 U/L (ref 0–44)
AST: 18 U/L (ref 15–41)
Albumin: 3.6 g/dL (ref 3.5–5.0)
Alkaline Phosphatase: 101 U/L (ref 38–126)
Anion gap: 11 (ref 5–15)
BUN: 18 mg/dL (ref 8–23)
CO2: 22 mmol/L (ref 22–32)
Calcium: 9.5 mg/dL (ref 8.9–10.3)
Chloride: 102 mmol/L (ref 98–111)
Creatinine, Ser: 0.72 mg/dL (ref 0.44–1.00)
GFR calc Af Amer: >60 mL/min (ref >60)
GFR calc non Af Amer: >60 mL/min (ref >60)
Glucose, Bld: 109 mg/dL — ABNORMAL HIGH (ref 70–99)
Potassium: 3.2 mmol/L — ABNORMAL LOW (ref 3.5–5.1)
Sodium: 135 mmol/L (ref 135–145)
Total Bilirubin: 0.5 mg/dL (ref 0.3–1.2)
Total Protein: 7.5 g/dL (ref 6.5–8.1)

## 2019-01-06 LAB — MAGNESIUM: Magnesium: 2.2 mg/dL (ref 1.7–2.4)

## 2019-01-06 MED ORDER — RISAQUAD PO CAPS
1.0000 | ORAL_CAPSULE | Freq: Every day | ORAL | Status: DC
  Administered 2019-01-07 – 2019-01-09 (×3): 1 via ORAL
  Filled 2019-01-06 (×4): qty 1

## 2019-01-06 MED ORDER — PANTOPRAZOLE SODIUM 40 MG PO TBEC
40.0000 mg | DELAYED_RELEASE_TABLET | Freq: Every day | ORAL | Status: DC
  Administered 2019-01-07 – 2019-01-09 (×3): 40 mg via ORAL
  Filled 2019-01-06 (×3): qty 1

## 2019-01-06 MED ORDER — SODIUM CHLORIDE 0.9 % IV SOLN: 250.0000 mL | INTRAVENOUS | Status: DC | PRN

## 2019-01-06 MED ORDER — GUAIFENESIN-DM 100-10 MG/5ML PO SYRP
5.0000 mL | ORAL_SOLUTION | ORAL | Status: DC | PRN
  Administered 2019-01-07 – 2019-01-08 (×3): 5 mL via ORAL
  Filled 2019-01-06 (×3): qty 5

## 2019-01-06 MED ORDER — BISACODYL 5 MG PO TBEC: 5.0000 mg | DELAYED_RELEASE_TABLET | Freq: Every day | ORAL | Status: DC | PRN

## 2019-01-06 MED ORDER — SENNOSIDES-DOCUSATE SODIUM 8.6-50 MG PO TABS: 1.0000 | ORAL_TABLET | Freq: Every evening | ORAL | Status: DC | PRN

## 2019-01-06 MED ORDER — POTASSIUM CHLORIDE CRYS ER 20 MEQ PO TBCR
40.0000 meq | EXTENDED_RELEASE_TABLET | Freq: Once | ORAL | Status: AC
  Administered 2019-01-06: 40 meq via ORAL
  Filled 2019-01-06: qty 2

## 2019-01-06 MED ORDER — ONDANSETRON HCL 4 MG PO TABS: 4.0000 mg | ORAL_TABLET | Freq: Four times a day (QID) | ORAL | Status: DC | PRN

## 2019-01-06 MED ORDER — SODIUM CHLORIDE 0.9% FLUSH
3.0000 mL | Freq: Two times a day (BID) | INTRAVENOUS | Status: DC
  Administered 2019-01-06 – 2019-01-09 (×5): 3 mL via INTRAVENOUS

## 2019-01-06 MED ORDER — ALBUTEROL SULFATE (2.5 MG/3ML) 0.083% IN NEBU: 2.5000 mg | INHALATION_SOLUTION | RESPIRATORY_TRACT | Status: DC | PRN

## 2019-01-06 MED ORDER — SODIUM CHLORIDE 0.9% FLUSH: 3.0000 mL | INTRAVENOUS | Status: DC | PRN

## 2019-01-06 MED ORDER — HYDROCODONE-ACETAMINOPHEN 5-325 MG PO TABS
1.0000 | ORAL_TABLET | ORAL | Status: DC | PRN
  Administered 2019-01-07: 1 via ORAL
  Administered 2019-01-07 (×2): 2 via ORAL
  Administered 2019-01-07: 1 via ORAL
  Administered 2019-01-08 – 2019-01-09 (×3): 2 via ORAL
  Filled 2019-01-06 (×3): qty 2
  Filled 2019-01-06: qty 1
  Filled 2019-01-06 (×3): qty 2
  Filled 2019-01-06: qty 1

## 2019-01-06 MED ORDER — ONDANSETRON HCL 4 MG/2ML IJ SOLN: 4.0000 mg | Freq: Four times a day (QID) | INTRAMUSCULAR | Status: DC | PRN

## 2019-01-06 MED ORDER — LACTATED RINGERS IV SOLN
INTRAVENOUS | Status: DC
  Administered 2019-01-06: 23:00:00 via INTRAVENOUS

## 2019-01-06 MED ORDER — BENAZEPRIL HCL 10 MG PO TABS
10.0000 mg | ORAL_TABLET | Freq: Every day | ORAL | Status: DC
  Filled 2019-01-06: qty 1

## 2019-01-06 MED ORDER — ENOXAPARIN SODIUM 40 MG/0.4ML ~~LOC~~ SOLN
40.0000 mg | SUBCUTANEOUS | Status: DC
  Administered 2019-01-06 – 2019-01-08 (×3): 40 mg via SUBCUTANEOUS
  Filled 2019-01-06 (×3): qty 0.4

## 2019-01-06 MED ORDER — ZOLPIDEM TARTRATE 5 MG PO TABS
5.0000 mg | ORAL_TABLET | Freq: Every evening | ORAL | Status: DC | PRN
  Administered 2019-01-06 – 2019-01-08 (×3): 5 mg via ORAL
  Filled 2019-01-06 (×3): qty 1

## 2019-01-06 MED ORDER — ACETAMINOPHEN 650 MG RE SUPP: 650.0000 mg | Freq: Four times a day (QID) | RECTAL | Status: DC | PRN

## 2019-01-06 MED ORDER — ACETAMINOPHEN 325 MG PO TABS: 650.0000 mg | ORAL_TABLET | Freq: Four times a day (QID) | ORAL | Status: DC | PRN

## 2019-01-06 MED ORDER — DIPHENHYDRAMINE HCL 25 MG PO CAPS: 25.0000 mg | ORAL_CAPSULE | Freq: Every day | ORAL | Status: DC | PRN

## 2019-01-06 MED ORDER — SIMVASTATIN 20 MG PO TABS
40.0000 mg | ORAL_TABLET | Freq: Every day | ORAL | Status: DC
  Administered 2019-01-06 – 2019-01-09 (×4): 40 mg via ORAL
  Filled 2019-01-06 (×5): qty 2

## 2019-01-06 MED ORDER — HYDROCHLOROTHIAZIDE 25 MG PO TABS: 25.0000 mg | ORAL_TABLET | Freq: Every day | ORAL | Status: DC

## 2019-01-06 MED ORDER — ESCITALOPRAM OXALATE 10 MG PO TABS
10.0000 mg | ORAL_TABLET | Freq: Every day | ORAL | Status: DC
  Administered 2019-01-07 – 2019-01-09 (×3): 10 mg via ORAL
  Filled 2019-01-06 (×4): qty 1

## 2019-01-06 NOTE — ED Notes (Signed)
Await be assignment, speaking without difficulty at rest.

## 2019-01-06 NOTE — H&P (Signed)
[facility] NAME: Christine Mullins    MR#:  [id number]  DATE OF BIRTH:  01/20/1946  DATE OF ADMISSION:  01/06/2019  PRIMARY CARE PHYSICIAN: Einar Pheasant, MD   REQUESTING/REFERRING PHYSICIAN: Arta Silence, MD  CHIEF COMPLAINT:   Chief Complaint  Patient presents with   Shortness of Breath   Shortness of breath for 3 weeks. HISTORY OF PRESENT ILLNESS:  Christine Mullins  is a 73 y.o. female with a known history of hypertension, hyperlipidemia, GERD, arthritis and depression.  The patient has had shortness of breath and cough with sputum for the past 3 weeks, which has been worsening.  She denies any fever or chills but has lost weight 15 pounds since last November.  CT of the chest in April and 1 week ago report multifocal A tenderness groundglass and consolidative opacity bilaterally, worse in the right lower lobe.  She was treated with Augmentin and prednisone without improvement.  She has hypoxia at 85% percent, put on oxygen by nasal cannula 3 [place] referred to ED for admission by Dr. Alva Garnet. PAST MEDICAL HISTORY:   Past Medical History:  Diagnosis Date   Arthritis    Depression    GERD (gastroesophageal reflux disease)    Hx of diverticulitis of colon    Hyperlipidemia    Hypertension     PAST SURGICAL HISTORY:   Past Surgical History:  Procedure Laterality Date   BREAST BIOPSY Left 1995   negative   CATARACT EXTRACTION, BILATERAL     CESAREAN SECTION     COLONOSCOPY WITH PROPOFOL N/A 05/15/2017   Procedure: COLONOSCOPY WITH PROPOFOL;  Surgeon: Lin Landsman, MD;  Location: [facility] ENDOSCOPY;  Service: Gastroenterology;  Laterality: N/A;   FOOT SURGERY Bilateral    GANGLION CYST EXCISION Left 1965   wrist   GANGLION CYST EXCISION Right 1980   hand   HYSTEROSCOPY W/D&C N/A 04/30/2017   Procedure: DILATATION AND CURETTAGE /HYSTEROSCOPY;  Surgeon: Defrancesco, Alanda Slim, MD;  Location: [facility] ORS;   Service: Gynecology;  Laterality: N/A;   KNEE ARTHROSCOPY Right 2000    SOCIAL HISTORY:   Social History   Tobacco Use   Smoking status: Never Smoker   Smokeless tobacco: Never Used  Substance Use Topics   Alcohol use: Yes    Comment: social    FAMILY HISTORY:   Family History  Problem Relation Age of Onset   Arthritis Mother    Hyperlipidemia Mother    Peripheral Artery Disease Mother    Hyperlipidemia Father    Stroke Father    Breast cancer Neg Hx    Ovarian cancer Neg Hx    Colon cancer Neg Hx     DRUG ALLERGIES:  No Known Allergies  REVIEW OF SYSTEMS:   Review of Systems  Constitutional: Positive for malaise/fatigue and weight loss. Negative for chills and fever.  HENT: Negative for sore throat.   Eyes: Negative for blurred vision and double vision.  Respiratory: Positive for cough, sputum production and shortness of breath. Negative for hemoptysis, wheezing and stridor.   Cardiovascular: Negative for chest pain, palpitations, orthopnea and leg swelling.  Gastrointestinal: Negative for abdominal pain, blood in stool, diarrhea, melena, nausea and vomiting.  Genitourinary: Negative for dysuria, flank pain and hematuria.  Musculoskeletal: Negative for back pain and joint pain.  Neurological: Negative for dizziness, sensory change, focal weakness, seizures, loss of consciousness, weakness and headaches.  Endo/Heme/Allergies: Negative for polydipsia.  Psychiatric/Behavioral: Negative for depression. The patient is  not nervous/anxious.     MEDICATIONS AT HOME:   Prior to Admission medications   Medication Sig Start Date End Date Taking? Authorizing Provider  aspirin EC 81 MG tablet Take 1 tablet (81 mg total) by mouth daily. 11/22/18   End, Harrell Gave, MD  benazepril (LOTENSIN) 10 MG tablet TAKE 1 TABLET BY MOUTH DAILY 12/03/18   Einar Pheasant, MD  diphenhydrAMINE (BENADRYL) 25 mg capsule Take 25 mg by mouth daily as needed for allergies.     [provider]  escitalopram (LEXAPRO) 10 MG tablet TAKE 1 TABLET BY MOUTH DAILY 12/03/18   Einar Pheasant, MD  hydrochlorothiazide (HYDRODIURIL) 25 MG tablet TAKE ONE TABLET BY MOUTH DAILY 03/07/18   Einar Pheasant, MD  Lancets [facility] DELICA PLUS [id number]) MISC CHECK BLOOD SUGAR ONCE DAILY AS DIRECTED 11/11/18   Einar Pheasant, MD  omeprazole (PRILOSEC) 40 MG capsule Take 40 mg by mouth daily.  07/12/17   [provider]  ONETOUCH VERIO test strip CHECK BLOOD SUGAR DAILY AS DIRECTED 11/11/18   Einar Pheasant, MD  Probiotic Product (PROBIOTIC DAILY PO) Take 1 capsule by mouth daily.    [provider]  simvastatin (ZOCOR) 40 MG tablet Take 40 mg by mouth daily.    [provider]  zolpidem (AMBIEN) 10 MG tablet TAKE 1/2 TABLET BY MOUTH AT BEDTIME AS NEEDED FOR SLEEP 12/25/18   Einar Pheasant, MD      VITAL SIGNS:  Blood pressure 123/79, pulse 83, temperature (!) 97.5 F (36.4 C), temperature source Oral, resp. rate (!) 24, height 5\' 2"  (1.575 m), weight 64.4 kg, SpO2 95 %.  PHYSICAL EXAMINATION:  Physical Exam  GENERAL:  73 y.o.-year-old patient lying in the bed with no acute distress.  EYES: Pupils equal, round, reactive to light and accommodation. No scleral icterus. Extraocular muscles intact.  HEENT: Head atraumatic, normocephalic. Oropharynx and nasopharynx clear.  NECK:  Supple, no jugular venous distention. No thyroid enlargement, no tenderness.  LUNGS: Very diminished breath sounds on the right lower part of the chest, no wheezing, rales,rhonchi or crepitation. No use of accessory muscles of respiration.  CARDIOVASCULAR: S1, S2 normal. No murmurs, rubs, or gallops.  ABDOMEN: Soft, nontender, nondistended. Bowel sounds present. No organomegaly or mass.  EXTREMITIES: No pedal edema, cyanosis, or clubbing.  NEUROLOGIC: Cranial nerves II through XII are intact. Muscle strength 5/5 in all extremities. Sensation intact. Gait not checked.    PSYCHIATRIC: The patient is alert and oriented x 3.  SKIN: No obvious rash, lesion, or ulcer.   LABORATORY PANEL:   CBC Recent Labs  Lab 01/06/19 1528  WBC 10.7*  HGB 14.6  HCT 42.3  PLT 330   ------------------------------------------------------------------------------------------------------------------  Chemistries  Recent Labs  Lab 01/06/19 1528  NA 135  K 3.2*  CL 102  CO2 22  GLUCOSE 109*  BUN 18  CREATININE 0.72  CALCIUM 9.5  AST 18  ALT 12  ALKPHOS 101  BILITOT 0.5   ------------------------------------------------------------------------------------------------------------------  Cardiac Enzymes No results for input(s): TROPONINI in the last 168 hours. ------------------------------------------------------------------------------------------------------------------  RADIOLOGY:  No results found.    IMPRESSION AND PLAN:   Acute respiratory failure with hypoxia due to extensive bilateral chronic pulmonary infiltrates. Patient will be admitted to medical floor. Continue oxygen by nasal cannula, Robitussin as needed, n.p.o. after midnight for bronchoscopy tomorrow. Dr. Alva Garnet would not initiate antibiotic therapy.  Hypokalemia.  Potassium supplement and follow-up level. Hypertension.  Continue home hypertension medication.  All the records are reviewed and case discussed with ED provider.  Management plans discussed with the patient, family and they are in agreement.  CODE STATUS:   TOTAL TIME TAKING CARE OF THIS PATIENT:  minutes.    Demetrios Loll M.D on 01/06/2019 at 4:47 PM  Between 7am to 6pm - Pager - [phone]  After 6pm go to [email or web address] - Proofreader  [facility]  Office  [phone]  CC: Primary care physician; Einar Pheasant, MD   Note: This dictation was prepared with Dragon dictation along with smaller phrase technology. Any transcriptional errors that result from this process are  unin

## 2019-01-06 NOTE — Progress Notes (Signed)
Advanced Care Plan.  Purpose of Encounter: CODE STATUS. Parties in Attendance: The patient and me. Patient's Decisional Capacity: Yes. Medical Story:  Christine Mullins  is a 73 y.o. female with a known history of hypertension, hyperlipidemia, GERD, arthritis and depression.   She is admitted for acute respiratory failure with hypoxia due to extensive bilateral chronic pulmonary infiltrates.  I discussed with patient about her current condition, prognosis and CODE STATUS.  Patient wants to be resuscitated and intubated if she has cardiopulmonary arrest. Plan:  Code Status: Full code. Time spent discussing advance care planning: 17 minutes.

## 2019-01-06 NOTE — Progress Notes (Signed)
PULMONARY CONSULT NOTE  Requesting MD/Service: Self Date of initial consultation: 01/06/19 Reason for consultation: Progressive pulmonary infiltrates with hypoxemic respiratory failure  PT PROFILE: 73 y.o. female with minimal smoking history with 1 year history of progressive pulmonary infiltrates and hypoxemia  DATA: 11/06/18 CT chest: Extensive airspace consolidation consistent with pneumonia throughout most of the right lower lobe. There is a small associated right pleural effusion. Multiple ill-defined somewhat nodular appearing opacities throughout the lungs bilaterally involving nearly every lobe in segment bilaterally. None of these lesions show cavitation. The differential considerations for these multiple opacities include multifocal pneumonia, developing septic emboli without cavitation, and multiple metastatic foci 12/31/18 CT chest: There is multifocal, very dense, predominantly subpleural ground-glass and consolidative opacity present bilaterally, worst in right lower lobe with near-total consolidation. Findings are significantly worsened, more confluent and dense compared to prior examination dated 11/06/2018  INTERVAL:  HPI:  Her history dates back approximately 1 year when she developed a "summer cold" by which she remains cough and sneezing.  The symptoms persisted through the month of July 2019 and she was seen by her primary care physician who ordered a chest x-ray which revealed "Progressive diffuse interstitial prominence throughout both lungs in a reticulonodular pattern. Findings are suggestive of chronic lung disease".  She was initially seen by Dr. Mortimer Fries 02/08/2018.  She was treated with azithromycin and albuterol inhaler.  She followed up in September with persisting complaints of cough and mucus production.  At that time, the working diagnosis was reactive airways disease which was reportedly better.  She developed a febrile illness in January 2020 and was noted to have a RLL  infiltrate on chest x-ray which was treated as pneumonia.  She was also treated with prednisone at this time.  She followed up in early March with inexorably progressive exertional dyspnea.  She continued to complain of cough and mucus production.  She was coughing to the point of gagging and emesis.  She also described right-sided chest discomfort.  She had had one episode of hemoptysis in February.  She was started on nebulized bronchodilators.  In early April, due to the SARS-CoV-2 pandemic, she was evaluated by telephone visit.  A CT scan of the chest was performed in April with results as documented above.  She was treated with Augmentin and prednisone.  Because of persistent symptoms, a repeat CT chest (reviewed above), 6-minute walk test and PFTs were ordered by Dr. Mortimer Fries.  The 6-minute walk test revealed mild desaturation to 89%.  PFTs were to be performed last week.  However, she was so dyspneic upon arrival to the pulmonary lab that she could not perform the test.  I was contacted and Dr. Zoila Shutter absence.  I looked at her x-rays and requested this evaluation.  At the time of my evaluation she was profoundly breathless after ambulating to the examination room.  She was also breathless with speech.  She continues to have severe cough throughout the day which is disruptive to sleep.  She has twinges of chest discomfort which are fleeting.  She reports profound exertional dyspnea which has progressively worsened in the past 2-3 weeks.  We ambulated her in the office on the day of this encounter and she desaturated to 85% on room air.  She recovered rapidly with nasal cannula oxygen and felt much better after application of oxygen.  Past Medical History:  Diagnosis Date  . Arthritis   . Depression   . GERD (gastroesophageal reflux disease)   . Hx of diverticulitis  of colon   . Hyperlipidemia   . Hypertension     Past Surgical History:  Procedure Laterality Date  . BREAST BIOPSY Left 1995    negative  . CATARACT EXTRACTION, BILATERAL    . CESAREAN SECTION    . COLONOSCOPY WITH PROPOFOL N/A 05/15/2017   Procedure: COLONOSCOPY WITH PROPOFOL;  Surgeon: Lin Landsman, MD;  Location: [facility] ENDOSCOPY;  Service: Gastroenterology;  Laterality: N/A;  . FOOT SURGERY Bilateral   . GANGLION CYST EXCISION Left 1965   wrist  . GANGLION CYST EXCISION Right 1980   hand  . HYSTEROSCOPY W/D&C N/A 04/30/2017   Procedure: DILATATION AND CURETTAGE /HYSTEROSCOPY;  Surgeon: Defrancesco, Alanda Slim, MD;  Location: [facility] ORS;  Service: Gynecology;  Laterality: N/A;  . KNEE ARTHROSCOPY Right 2000    MEDICATIONS: I have reviewed all medications and confirmed regimen as documented  Social History   Socioeconomic History  . Marital status: Single    Spouse name: Not on file  . Number of children: Not on file  . Years of education: Not on file  . Highest education level: Not on file  Occupational History  . Not on file  Social Needs  . Financial resource strain: Not on file  . Food insecurity    Worry: Not on file    Inability: Not on file  . Transportation needs    Medical: Not on file    Non-medical: Not on file  Tobacco Use  . Smoking status: Never Smoker  . Smokeless tobacco: Never Used  Substance and Sexual Activity  . Alcohol use: Yes    Comment: social  . Drug use: No  . Sexual activity: Not Currently    Birth control/protection: Post-menopausal  Lifestyle  . Physical activity    Days per week: Not on file    Minutes per session: Not on file  . Stress: Not on file  Relationships  . Social Herbalist on phone: Not on file    Gets together: Not on file    Attends religious service: Not on file    Active member of club or organization: Not on file    Attends meetings of clubs or organizations: Not on file    Relationship status: Not on file  . Intimate partner violence    Fear of current or ex partner: Not on file    Emotionally abused: Not on file     Physically abused: Not on file    Forced sexual activity: Not on file  Other Topics Concern  . Not on file  Social History Narrative  . Not on file    Family History  Problem Relation Age of Onset  . Arthritis Mother   . Hyperlipidemia Mother   . Peripheral Artery Disease Mother   . Hyperlipidemia Father   . Stroke Father   . Breast cancer Neg Hx   . Ovarian cancer Neg Hx   . Colon cancer Neg Hx     ROS: No fever, myalgias/arthralgias She initially lost 10-15 pounds from the onset of this illness 1 year ago but has gained approximately 5 pounds back No new focal weakness or sensory deficits No otalgia, hearing loss, visual changes, nasal and sinus symptoms, mouth and throat problems No neck pain or adenopathy No abdominal pain, N/V/D, diarrhea, change in bowel pattern No dysuria, change in urinary pattern   Vitals:   01/06/19 1355 01/06/19 1357  BP:  (!) 98/52  Pulse:  97  Temp:  98.2 F (  36.8 C)  TempSrc:  Temporal  SpO2:  91%  Weight: 143 lb 12.8 oz (65.2 kg)   Height: 5\' 2"  (1.575 m)   Room air   EXAM:  Gen: WDWN, dyspneic with speech HEENT: NCAT, sclera white, oropharynx normal Neck: Supple without LAN, thyromegaly, JVD Lungs: Bibasilar crackles, dull to percussion with bronchial breath sounds in RLL, no wheezes Cardiovascular: RRR, no murmurs noted Abdomen: Soft, nontender, normal BS Ext: without clubbing, cyanosis, edema Neuro: CNs grossly intact, motor and sensory intact Skin: Limited exam, no lesions noted  DATA:   BMP Latest Ref Rng & Units 11/06/2018 01/02/2018 12/18/2017  Glucose 70 - 99 mg/dL - - -  BUN 6 - 23 mg/dL - - -  Creatinine 0.44 - 1.00 mg/dL 0.80 - -  Sodium 135 - 145 mEq/L - - -  Potassium 3.5 - 5.3 mmol/L - 3.5 3.5  Chloride 96 - 112 mEq/L - - -  CO2 19 - 32 mEq/L - - -  Calcium 8.4 - 10.5 mg/dL - - -    CBC Latest Ref Rng & Units 01/06/2019 04/25/2017 05/05/2016  WBC 4.0 - 10.5 K/uL 10.7(H) 7.4 5.7  Hemoglobin 12.0 - 15.0 g/dL  14.6 13.8 14.1  Hematocrit 36.0 - 46.0 % 42.3 39.9 41.3  Platelets 150 - 400 K/uL 330 297 287.0    CXR 06/29: Extensive R >L airspace disease with a lower lobe predominance  I have personally reviewed all chest radiographs reported above including CXRs and CT chest unless otherwise indicated  IMPRESSION:     ICD-10-CM   1. Acute on chronic respiratory failure with hypoxia (HCC)  J96.21   2. Extensive bilateral chronic pulmonary infiltrates  R91.8    At this point, the differential diagnosis is broad including chronic pulmonary infections (noting her travel history to [place]), noninfectious inflammatory disorder such as BOOP and malignancy such as bronchoalveolar cell carcinoma (lepidic type adenocarcinoma).   PLAN:  She desperately needs a diagnostic intervention.  I think bronchoscopy has a high yield.  However, given her respiratory status it would also be a relatively high risk bronchoscopy to perform.  We discussed the procedure including its indications, risks (respiratory failure, bleeding complications, pneumothorax) and alternatives (surgical lung biopsy).  I have arranged for bronchoscopy to be performed 01/07/2019 at 1 PM.  Our initial intention was to send her home with home oxygen therapy.  However, after further discussion she expressed that she feels that she is now too dyspneic to function at home and wishes for hospitalization which seems reasonable.  I have contacted the admitting hospitalist and we sent her to the emergency department to await direct admission.  My recommendation is that she be treated with oxygen, nebulized bronchodilators.  I would not initiate antibiotic therapy.  We will keep her on the schedule for bronchoscopy 6/30   Merton Border, MD PCCM service Mobile [phone] Pager [phone] 01/06/2019 3:39 PM

## 2019-01-06 NOTE — ED Notes (Signed)
ED TO INPATIENT HANDOFF REPORT  ED Nurse Name and Phone #: [phone]  S Name/Age/Gender Christine Mullins 73 y.o. female Room/Bed: ED04A/ED04A  Code Status   Code Status: Not on file  Home/SNF/Other Home Is this baseline? Yes   Triage Complete: Triage complete  Chief Complaint [facility]  Triage Note PT sent by pulmonologist office for inability to maintain oxygen saturation above 90% on room air (pt's baseline). Pt's oxygen saturation dropped to 86% on room air per pulmonologist nurse. Pt denies current pain.   Allergies No Known Allergies  Level of Care/Admitting Diagnosis ED Disposition    ED Disposition Condition [facility] Area: [place] [[id number]]  Level of Care: Med-Surg [16]  Covid Evaluation: Confirmed COVID Negative  Diagnosis: Acute respiratory failure with hypoxia [facility]) [[id number]]  Admitting Physician: Demetrios Loll [[id number]]  Attending Physician: Demetrios Loll [phone]  Estimated length of stay: past midnight tomorrow  Certification:: I certify this patient will need inpatient services for at least 2 midnights  PT Class (Do Not Modify): Inpatient [101]  PT Acc Code (Do Not Modify): Private [1]       B Medical/Surgery History Past Medical History:  Diagnosis Date  . Arthritis   . Depression   . GERD (gastroesophageal reflux disease)   . Hx of diverticulitis of colon   . Hyperlipidemia   . Hypertension    Past Surgical History:  Procedure Laterality Date  . BREAST BIOPSY Left 1995   negative  . CATARACT EXTRACTION, BILATERAL    . CESAREAN SECTION    . COLONOSCOPY WITH PROPOFOL N/A 05/15/2017   Procedure: COLONOSCOPY WITH PROPOFOL;  Surgeon: Lin Landsman, MD;  Location[facility] ENDOSCOPY;  Service: Gastroenterology;  Laterality: N/A;  . FOOT SURGERY Bilateral   . GANGLION CYST EXCISION Left 1965   wrist  . GANGLION CYST EXCISION Right 1980   hand  . HYSTEROSCOPY W/D&C N/A 04/30/2017   Procedure: DILATATION AND CURETTAGE  /HYSTEROSCOPY;  Surgeon: Defrancesco, Alanda Slim, MD;  Location: [facility] ORS;  Service: Gynecology;  Laterality: N/A;  . KNEE ARTHROSCOPY Right 2000     A IV Location/Drains/Wounds Patient Lines/Drains/Airways Status   Active Line/Drains/Airways    Name:   Placement date:   Placement time:   Site:   Days:   Peripheral IV 01/06/19 Left Arm   01/06/19    1527    Arm   less than 1   Incision (Closed) 04/30/17 Vagina   04/30/17    0936     616          Intake/Output Last 24 hours No intake or output data in the 24 hours ending 01/06/19 1713  Labs/Imaging Results for orders placed or performed during the hospital encounter of 01/06/19 (from the past 48 hour(s))  CBC with Differential     Status: Abnormal   Collection Time: 01/06/19  3:28 PM  Result Value Ref Range   WBC 10.7 (H) 4.0 - 10.5 K/uL   RBC 4.92 3.87 - 5.11 MIL/uL   Hemoglobin 14.6 12.0 - 15.0 g/dL   HCT 42.3 36.0 - 46.0 %   MCV 86.0 80.0 - 100.0 fL   MCH 29.7 26.0 - 34.0 pg   MCHC 34.5 30.0 - 36.0 g/dL   RDW 13.4 11.5 - 15.5 %   Platelets 330 150 - 400 K/uL   nRBC 0.0 0.0 - 0.2 %   Neutrophils Relative % 70 %   Neutro Abs 7.6 1.7 - 7.7 K/uL   Lymphocytes  Relative 15 %   Lymphs Abs 1.6 0.7 - 4.0 K/uL   Monocytes Relative 12 %   Monocytes Absolute 1.2 (H) 0.1 - 1.0 K/uL   Eosinophils Relative 1 %   Eosinophils Absolute 0.1 0.0 - 0.5 K/uL   Basophils Relative 1 %   Basophils Absolute 0.1 0.0 - 0.1 K/uL   Immature Granulocytes 1 %   Abs Immature Granulocytes 0.05 0.00 - 0.07 K/uL    Comment: Performed at [facility], [address] [place], [place] [address]  Comprehensive metabolic panel     Status: Abnormal   Collection Time: 01/06/19  3:28 PM  Result Value Ref Range   Sodium 135 135 - 145 mmol/L   Potassium 3.2 (L) 3.5 - 5.1 mmol/L   Chloride 102 98 - 111 mmol/L   CO2 22 22 - 32 mmol/L   Glucose, Bld 109 (H) 70 - 99 mg/dL   BUN 18 8 - 23 mg/dL   Creatinine, Ser 0.72 0.44 - 1.00 mg/dL   Calcium 9.5  8.9 - 10.3 mg/dL   Total Protein 7.5 6.5 - 8.1 g/dL   Albumin 3.6 3.5 - 5.0 g/dL   AST 18 15 - 41 U/L   ALT 12 0 - 44 U/L   Alkaline Phosphatase 101 38 - 126 U/L   Total Bilirubin 0.5 0.3 - 1.2 mg/dL   GFR calc non Af Amer >60 >60 mL/min   GFR calc Af Amer >60 >60 mL/min   Anion gap 11 5 - 15    Comment: Performed at [facility], [address] [place], [place] [address]   No results found.  Pending Labs [employer] (From admission, onward)    Start     Ordered   Signed and Held  HIV antibody (Routine Testing)  Once,   R     Signed and Held   Signed and Occupational hygienist morning,   R     Signed and Held   Signed and Held  CBC  Tomorrow morning,   R     Signed and Held   Signed and Held  Magnesium  Add-on,   R     Signed and Held          Vitals/Pain Today's Vitals   01/06/19 1517 01/06/19 1520 01/06/19 1541 01/06/19 1707  BP: (!) 114/57  123/79 126/72  Pulse: 84  83 90  Resp: (!) 26  (!) 24 20  Temp: (!) 97.5 F (36.4 C)     TempSrc: Oral     SpO2: 93%  95% 95%  Weight:  64.4 kg    Height:  5\' 2"  (1.575 m)    PainSc:  0-No pain 0-No pain     Isolation Precautions No active isolations  Medications Medications - No data to display  Mobility walks Low fall risk   Focused Assessments Pulmonary Assessment Handoff:  Lung sounds:   O2 Device: Nasal Cannula O2 Flow Rate (L/min): 3 L/min      R Recommendations: See Admitting Provider Note  Report given to:   Additional Notes: SOB at mild exertion, has had SATS of 88 at home,  02 3l, sat 96

## 2019-01-06 NOTE — ED Provider Notes (Signed)
[facility] Emergency Department Provider Note ____________________________________________   First MD Initiated Contact with Patient 01/06/19 1545     (approximate)  I have reviewed the triage vital signs and the nursing notes.   HISTORY  Chief Complaint Shortness of Breath    HPI Christine Mullins is a 72 y.o. female with PMH as noted below who presents with worsening shortness of breath and hypoxia gradual onset over the last 3 weeks.  The patient is scheduled for bronchoscopy tomorrow and was seeing her pulmonologist Dr. Jamal Collin today.  She was referred here for admission due to persistent hypoxia below 90% on room air.  The patient reports a cough but no fever or chills and no GI symptoms.  She was tested for COVID-19 3 days ago and was negative.  Past Medical History:  Diagnosis Date  . Arthritis   . Depression   . GERD (gastroesophageal reflux disease)   . Hx of diverticulitis of colon   . Hyperlipidemia   . Hypertension     Patient Active Problem List   Diagnosis Date Noted  . Chest pain of uncertain etiology 66/59/9357  . Shortness of breath 11/22/2018  . Coronary artery calcification 11/22/2018  . Aortic atherosclerosis ([place]) 11/22/2018  . Cough 01/29/2018  . Left hand pain 09/30/2017  . Hyperglycemia 09/30/2017  . Colon cancer screening   . History of colonic polyps   . Status post D&C 04/30/2017  . Cervical stenosis (uterine cervix) 04/18/2017  . Endometrial mass 04/18/2017  . Abnormal ultrasound 04/18/2017  . Lower abdominal pain 02/25/2017  . Impingement syndrome, shoulder, right 09/28/2016  . Primary osteoarthritis of right knee 09/28/2016  . Osteoporosis 07/10/2016  . Health care maintenance 06/07/2016  . Diverticulosis of colon without hemorrhage 04/30/2016  . Right knee pain 04/30/2016  . Essential hypertension 04/30/2016  . Hypercholesterolemia 04/30/2016  . GERD (gastroesophageal reflux disease) 04/30/2016    Past  Surgical History:  Procedure Laterality Date  . BREAST BIOPSY Left 1995   negative  . CATARACT EXTRACTION, BILATERAL    . CESAREAN SECTION    . COLONOSCOPY WITH PROPOFOL N/A 05/15/2017   Procedure: COLONOSCOPY WITH PROPOFOL;  Surgeon: Lin Landsman, MD;  Location: [facility] ENDOSCOPY;  Service: Gastroenterology;  Laterality: N/A;  . FOOT SURGERY Bilateral   . GANGLION CYST EXCISION Left 1965   wrist  . GANGLION CYST EXCISION Right 1980   hand  . HYSTEROSCOPY W/D&C N/A 04/30/2017   Procedure: DILATATION AND CURETTAGE /HYSTEROSCOPY;  Surgeon: Defrancesco, Alanda Slim, MD;  Location: [facility] ORS;  Service: Gynecology;  Laterality: N/A;  . KNEE ARTHROSCOPY Right 2000    Prior to Admission medications   Medication Sig Start Date End Date Taking? Authorizing Provider  aspirin EC 81 MG tablet Take 1 tablet (81 mg total) by mouth daily. 11/22/18   End, Harrell Gave, MD  benazepril (LOTENSIN) 10 MG tablet TAKE 1 TABLET BY MOUTH DAILY 12/03/18   Einar Pheasant, MD  diphenhydrAMINE (BENADRYL) 25 mg capsule Take 25 mg by mouth daily as needed for allergies.    [provider]  escitalopram (LEXAPRO) 10 MG tablet TAKE 1 TABLET BY MOUTH DAILY 12/03/18   Einar Pheasant, MD  hydrochlorothiazide (HYDRODIURIL) 25 MG tablet TAKE ONE TABLET BY MOUTH DAILY 03/07/18   Einar Pheasant, MD  Lancets [facility] DELICA PLUS [id number]) MISC CHECK BLOOD SUGAR ONCE DAILY AS DIRECTED 11/11/18   Einar Pheasant, MD  omeprazole (PRILOSEC) 40 MG capsule Take 40 mg by mouth daily.  07/12/17   [provider]  ONETOUCH VERIO test strip CHECK BLOOD SUGAR DAILY AS DIRECTED 11/11/18   Einar Pheasant, MD  Probiotic Product (PROBIOTIC DAILY PO) Take 1 capsule by mouth daily.    [provider]  simvastatin (ZOCOR) 40 MG tablet Take 40 mg by mouth daily.    [provider]  zolpidem (AMBIEN) 10 MG tablet TAKE 1/2 TABLET BY MOUTH AT BEDTIME AS NEEDED FOR SLEEP 12/25/18   Einar Pheasant, MD    Allergies  Patient has no known allergies.  Family History  Problem Relation Age of Onset  . Arthritis Mother   . Hyperlipidemia Mother   . Peripheral Artery Disease Mother   . Hyperlipidemia Father   . Stroke Father   . Breast cancer Neg Hx   . Ovarian cancer Neg Hx   . Colon cancer Neg Hx     Social History Social History   Tobacco Use  . Smoking status: Never Smoker  . Smokeless tobacco: Never Used  Substance Use Topics  . Alcohol use: Yes    Comment: social  . Drug use: No    Review of Systems  Constitutional: No fever. Eyes: No redness. ENT: No sore throat. Cardiovascular: Denies chest pain. Respiratory: Positive for shortness of breath. Gastrointestinal: No vomiting or diarrhea.  Genitourinary: Negative for dysuria.  Musculoskeletal: Negative for back pain. Skin: Negative for rash. Neurological: Negative for headache.   ____________________________________________   PHYSICAL EXAM:  VITAL SIGNS: ED Triage Vitals  Enc Vitals Group     BP 01/06/19 1517 (!) 114/57     Pulse Rate 01/06/19 1517 84     Resp 01/06/19 1517 (!) 26     Temp 01/06/19 1517 (!) 97.5 F (36.4 C)     Temp Source 01/06/19 1517 Oral     SpO2 01/06/19 1517 93 %     Weight 01/06/19 1520 142 lb (64.4 kg)     Height 01/06/19 1520 5\' 2"  (1.575 m)     Head Circumference --      Peak Flow --      Pain Score 01/06/19 1520 0     Pain Loc --      Pain Edu? --      Excl. in [place]? --     Constitutional: Alert and oriented. Well appearing and in no acute distress. Eyes: Conjunctivae are normal.  Head: Atraumatic. Nose: No congestion/rhinnorhea. Mouth/Throat: Mucous membranes are moist.   Neck: Normal range of motion.  Cardiovascular: Normal rate, regular rhythm. Grossly normal heart sounds.  Good peripheral circulation. Respiratory: Slightly increased respiratory effort.  No retractions. Lungs CTAB. Gastrointestinal: No distention.  Musculoskeletal:  Extremities warm and well perfused.   Neurologic:  Normal speech and language. No gross focal neurologic deficits are appreciated.  Skin:  Skin is warm and dry. No rash noted. Psychiatric: Mood and affect are normal. Speech and behavior are normal.  ____________________________________________   LABS (all labs ordered are listed, but only abnormal results are displayed)  Labs Reviewed  CBC WITH DIFFERENTIAL/PLATELET - Abnormal; Notable for the following components:      Result Value   WBC 10.7 (*)    Monocytes Absolute 1.2 (*)    All other components within normal limits  COMPREHENSIVE METABOLIC PANEL - Abnormal; Notable for the following components:   Potassium 3.2 (*)    Glucose, Bld 109 (*)    All other components within normal limits   ____________________________________________  EKG   ____________________________________________  RADIOLOGY    ____________________________________________   PROCEDURES  Procedure(s) performed: No  Procedures  Critical Care performed: No ____________________________________________   INITIAL IMPRESSION / ASSESSMENT AND PLAN / ED COURSE  Pertinent labs & imaging results that were available during my care of the patient were reviewed by me and considered in my medical decision making (see chart for details).  73 year old female with PMH as noted above currently being worked up for acute on chronic respiratory failure with extensive bilateral pulmonary infiltrates.  She is scheduled for a bronchoscopy tomorrow and saw her pulmonologist Dr. Jamal Collin today.  Due to persistent hypoxia she was referred to the emergency department for admission.  She had a negative COVID-19 swab 3 days ago.  On exam she has no acute respiratory distress and her O2 saturation is in the mid 90s on nasal cannula.  I signed the patient out to the hospitalist Dr. Estanislado Pandy for admission.  _____________________________  [name] was evaluated in Emergency Department on 01/06/2019 for the  symptoms described in the history of present illness. She was evaluated in the context of the global COVID-19 pandemic, which necessitated consideration that the patient might be at risk for infection with the SARS-CoV-2 virus that causes COVID-19. Institutional protocols and algorithms that pertain to the evaluation of patients at risk for COVID-19 are in a state of rapid change based on information released by regulatory bodies including the CDC and federal and state organizations. These policies and algorithms were followed during the patient's care in the ED. ____________________________________________   FINAL CLINICAL IMPRESSION(S) / ED DIAGNOSES  Final diagnoses:  Acute on chronic respiratory failure with hypoxia (HCC)      NEW MEDICATIONS STARTED DURING THIS VISIT:  New Prescriptions   No medications on file     Note:  This document was prepared using Dragon voice recognition software and may include unintentional dictation errors.   Arta Silence, MD 01/06/19 [phone]

## 2019-01-06 NOTE — Patient Instructions (Addendum)
Oxygen therapy initiated  Bronchoscopy scheduled for 6/30 as we discussed.  Do not take aspirin tonight or tomorrow before the procedure  Follow-up will be arranged

## 2019-01-06 NOTE — ED Notes (Signed)
Report to floor, to room 257.

## 2019-01-06 NOTE — Telephone Encounter (Signed)
Okay to proceed with appt.  covid testing was negative.

## 2019-01-06 NOTE — ED Triage Notes (Signed)
PT sent by pulmonologist office for inability to maintain oxygen saturation above 90% on room air (pt's baseline). Pt's oxygen saturation dropped to 86% on room air per pulmonologist nurse. Pt denies current pain.

## 2019-01-06 NOTE — H&P (View-Only) (Signed)
PULMONARY CONSULT NOTE  Requesting MD/Service: Self Date of initial consultation: 01/06/19 Reason for consultation: Progressive pulmonary infiltrates with hypoxemic respiratory failure  PT PROFILE: 73 y.o. female with minimal smoking history with 1 year history of progressive pulmonary infiltrates and hypoxemia  DATA: 11/06/18 CT chest: Extensive airspace consolidation consistent with pneumonia throughout most of the right lower lobe. There is a small associated right pleural effusion. Multiple ill-defined somewhat nodular appearing opacities throughout the lungs bilaterally involving nearly every lobe in segment bilaterally. None of these lesions show cavitation. The differential considerations for these multiple opacities include multifocal pneumonia, developing septic emboli without cavitation, and multiple metastatic foci 12/31/18 CT chest: There is multifocal, very dense, predominantly subpleural ground-glass and consolidative opacity present bilaterally, worst in right lower lobe with near-total consolidation. Findings are significantly worsened, more confluent and dense compared to prior examination dated 11/06/2018  INTERVAL:  HPI:  Her history dates back approximately 1 year when she developed a "summer cold" by which she remains cough and sneezing.  The symptoms persisted through the month of July 2019 and she was seen by her primary care physician who ordered a chest x-ray which revealed "Progressive diffuse interstitial prominence throughout both lungs in a reticulonodular pattern. Findings are suggestive of chronic lung disease".  She was initially seen by Dr. Mortimer Fries 02/08/2018.  She was treated with azithromycin and albuterol inhaler.  She followed up in September with persisting complaints of cough and mucus production.  At that time, the working diagnosis was reactive airways disease which was reportedly better.  She developed a febrile illness in January 2020 and was noted to have a RLL  infiltrate on chest x-ray which was treated as pneumonia.  She was also treated with prednisone at this time.  She followed up in early March with inexorably progressive exertional dyspnea.  She continued to complain of cough and mucus production.  She was coughing to the point of gagging and emesis.  She also described right-sided chest discomfort.  She had had one episode of hemoptysis in February.  She was started on nebulized bronchodilators.  In early April, due to the SARS-CoV-2 pandemic, she was evaluated by telephone visit.  A CT scan of the chest was performed in April with results as documented above.  She was treated with Augmentin and prednisone.  Because of persistent symptoms, a repeat CT chest (reviewed above), 6-minute walk test and PFTs were ordered by Dr. Mortimer Fries.  The 6-minute walk test revealed mild desaturation to 89%.  PFTs were to be performed last week.  However, she was so dyspneic upon arrival to the pulmonary lab that she could not perform the test.  I was contacted and Dr. Zoila Shutter absence.  I looked at her x-rays and requested this evaluation.  At the time of my evaluation she was profoundly breathless after ambulating to the examination room.  She was also breathless with speech.  She continues to have severe cough throughout the day which is disruptive to sleep.  She has twinges of chest discomfort which are fleeting.  She reports profound exertional dyspnea which has progressively worsened in the past 2-3 weeks.  We ambulated her in the office on the day of this encounter and she desaturated to 85% on room air.  She recovered rapidly with nasal cannula oxygen and felt much better after application of oxygen.  Past Medical History:  Diagnosis Date  . Arthritis   . Depression   . GERD (gastroesophageal reflux disease)   . Hx of diverticulitis  of colon   . Hyperlipidemia   . Hypertension     Past Surgical History:  Procedure Laterality Date  . BREAST BIOPSY Left 1995    negative  . CATARACT EXTRACTION, BILATERAL    . CESAREAN SECTION    . COLONOSCOPY WITH PROPOFOL N/A 05/15/2017   Procedure: COLONOSCOPY WITH PROPOFOL;  Surgeon: Lin Landsman, MD;  Location: [facility] ENDOSCOPY;  Service: Gastroenterology;  Laterality: N/A;  . FOOT SURGERY Bilateral   . GANGLION CYST EXCISION Left 1965   wrist  . GANGLION CYST EXCISION Right 1980   hand  . HYSTEROSCOPY W/D&C N/A 04/30/2017   Procedure: DILATATION AND CURETTAGE /HYSTEROSCOPY;  Surgeon: Defrancesco, Alanda Slim, MD;  Location: [facility] ORS;  Service: Gynecology;  Laterality: N/A;  . KNEE ARTHROSCOPY Right 2000    MEDICATIONS: I have reviewed all medications and confirmed regimen as documented  Social History   Socioeconomic History  . Marital status: Single    Spouse name: Not on file  . Number of children: Not on file  . Years of education: Not on file  . Highest education level: Not on file  Occupational History  . Not on file  Social Needs  . Financial resource strain: Not on file  . Food insecurity    Worry: Not on file    Inability: Not on file  . Transportation needs    Medical: Not on file    Non-medical: Not on file  Tobacco Use  . Smoking status: Never Smoker  . Smokeless tobacco: Never Used  Substance and Sexual Activity  . Alcohol use: Yes    Comment: social  . Drug use: No  . Sexual activity: Not Currently    Birth control/protection: Post-menopausal  Lifestyle  . Physical activity    Days per week: Not on file    Minutes per session: Not on file  . Stress: Not on file  Relationships  . Social Herbalist on phone: Not on file    Gets together: Not on file    Attends religious service: Not on file    Active member of club or organization: Not on file    Attends meetings of clubs or organizations: Not on file    Relationship status: Not on file  . Intimate partner violence    Fear of current or ex partner: Not on file    Emotionally abused: Not on file     Physically abused: Not on file    Forced sexual activity: Not on file  Other Topics Concern  . Not on file  Social History Narrative  . Not on file    Family History  Problem Relation Age of Onset  . Arthritis Mother   . Hyperlipidemia Mother   . Peripheral Artery Disease Mother   . Hyperlipidemia Father   . Stroke Father   . Breast cancer Neg Hx   . Ovarian cancer Neg Hx   . Colon cancer Neg Hx     ROS: No fever, myalgias/arthralgias She initially lost 10-15 pounds from the onset of this illness 1 year ago but has gained approximately 5 pounds back No new focal weakness or sensory deficits No otalgia, hearing loss, visual changes, nasal and sinus symptoms, mouth and throat problems No neck pain or adenopathy No abdominal pain, N/V/D, diarrhea, change in bowel pattern No dysuria, change in urinary pattern   Vitals:   01/06/19 1355 01/06/19 1357  BP:  (!) 98/52  Pulse:  97  Temp:  98.2 F (  36.8 C)  TempSrc:  Temporal  SpO2:  91%  Weight: 143 lb 12.8 oz (65.2 kg)   Height: 5\' 2"  (1.575 m)   Room air   EXAM:  Gen: WDWN, dyspneic with speech HEENT: NCAT, sclera white, oropharynx normal Neck: Supple without LAN, thyromegaly, JVD Lungs: Bibasilar crackles, dull to percussion with bronchial breath sounds in RLL, no wheezes Cardiovascular: RRR, no murmurs noted Abdomen: Soft, nontender, normal BS Ext: without clubbing, cyanosis, edema Neuro: CNs grossly intact, motor and sensory intact Skin: Limited exam, no lesions noted  DATA:   BMP Latest Ref Rng & Units 11/06/2018 01/02/2018 12/18/2017  Glucose 70 - 99 mg/dL - - -  BUN 6 - 23 mg/dL - - -  Creatinine 0.44 - 1.00 mg/dL 0.80 - -  Sodium 135 - 145 mEq/L - - -  Potassium 3.5 - 5.3 mmol/L - 3.5 3.5  Chloride 96 - 112 mEq/L - - -  CO2 19 - 32 mEq/L - - -  Calcium 8.4 - 10.5 mg/dL - - -    CBC Latest Ref Rng & Units 01/06/2019 04/25/2017 05/05/2016  WBC 4.0 - 10.5 K/uL 10.7(H) 7.4 5.7  Hemoglobin 12.0 - 15.0 g/dL  14.6 13.8 14.1  Hematocrit 36.0 - 46.0 % 42.3 39.9 41.3  Platelets 150 - 400 K/uL 330 297 287.0    CXR 06/29: Extensive R >L airspace disease with a lower lobe predominance  I have personally reviewed all chest radiographs reported above including CXRs and CT chest unless otherwise indicated  IMPRESSION:     ICD-10-CM   1. Acute on chronic respiratory failure with hypoxia (HCC)  J96.21   2. Extensive bilateral chronic pulmonary infiltrates  R91.8    At this point, the differential diagnosis is broad including chronic pulmonary infections (noting her travel history to [place]), noninfectious inflammatory disorder such as BOOP and malignancy such as bronchoalveolar cell carcinoma (lepidic type adenocarcinoma).   PLAN:  She desperately needs a diagnostic intervention.  I think bronchoscopy has a high yield.  However, given her respiratory status it would also be a relatively high risk bronchoscopy to perform.  We discussed the procedure including its indications, risks (respiratory failure, bleeding complications, pneumothorax) and alternatives (surgical lung biopsy).  I have arranged for bronchoscopy to be performed 01/07/2019 at 1 PM.  Our initial intention was to send her home with home oxygen therapy.  However, after further discussion she expressed that she feels that she is now too dyspneic to function at home and wishes for hospitalization which seems reasonable.  I have contacted the admitting hospitalist and we sent her to the emergency department to await direct admission.  My recommendation is that she be treated with oxygen, nebulized bronchodilators.  I would not initiate antibiotic therapy.  We will keep her on the schedule for bronchoscopy 6/30   Merton Border, MD PCCM service Mobile [phone] Pager [phone] 01/06/2019 3:39 PM

## 2019-01-07 ENCOUNTER — Inpatient Hospital Stay: Payer: Medicare Other

## 2019-01-07 ENCOUNTER — Ambulatory Visit: Admission: RE | Admit: 2019-01-07 | Payer: Medicare Other | Source: Home / Self Care | Admitting: Pulmonary Disease

## 2019-01-07 ENCOUNTER — Encounter
Admission: EM | Disposition: A | Discharge status: Home / Self Care | Payer: Self-pay | Source: Home / Self Care | Attending: Internal Medicine

## 2019-01-07 ENCOUNTER — Encounter: Payer: Self-pay | Admitting: *Deleted

## 2019-01-07 ENCOUNTER — Other Ambulatory Visit: Payer: Self-pay

## 2019-01-07 DIAGNOSIS — R918 Other nonspecific abnormal finding of lung field: Secondary | ICD-10-CM

## 2019-01-07 DIAGNOSIS — J9601 Acute respiratory failure with hypoxia: Secondary | ICD-10-CM

## 2019-01-07 DIAGNOSIS — J181 Lobar pneumonia, unspecified organism: Secondary | ICD-10-CM

## 2019-01-07 HISTORY — PX: FLEXIBLE BRONCHOSCOPY: SHX5094

## 2019-01-07 LAB — CBC
HCT: 38.4 % (ref 36.0–46.0)
Hemoglobin: 13.1 g/dL (ref 12.0–15.0)
MCH: 29.6 pg (ref 26.0–34.0)
MCHC: 34.1 g/dL (ref 30.0–36.0)
MCV: 86.9 fL (ref 80.0–100.0)
Platelets: 279 10*3/uL (ref 150–400)
RBC: 4.42 MIL/uL (ref 3.87–5.11)
RDW: 13.3 % (ref 11.5–15.5)
WBC: 10.5 10*3/uL (ref 4.0–10.5)
nRBC: 0 % (ref 0.0–0.2)

## 2019-01-07 LAB — BASIC METABOLIC PANEL
Anion gap: 9 (ref 5–15)
BUN: 16 mg/dL (ref 8–23)
CO2: 25 mmol/L (ref 22–32)
Calcium: 8.8 mg/dL — ABNORMAL LOW (ref 8.9–10.3)
Chloride: 104 mmol/L (ref 98–111)
Creatinine, Ser: 0.72 mg/dL (ref 0.44–1.00)
GFR calc Af Amer: >60 mL/min (ref >60)
GFR calc non Af Amer: >60 mL/min (ref >60)
Glucose, Bld: 106 mg/dL — ABNORMAL HIGH (ref 70–99)
Potassium: 3.5 mmol/L (ref 3.5–5.1)
Sodium: 138 mmol/L (ref 135–145)

## 2019-01-07 SURGERY — BRONCHOSCOPY, FLEXIBLE
Anesthesia: Moderate Sedation | Laterality: Right

## 2019-01-07 MED ORDER — MIDAZOLAM HCL 2 MG/2ML IJ SOLN
INTRAMUSCULAR | Status: DC | PRN
  Administered 2019-01-07: 1 mg via INTRAVENOUS
  Administered 2019-01-07: 2 mg via INTRAVENOUS

## 2019-01-07 MED ORDER — LIDOCAINE HCL 2 % EX GEL
1.0000 "application " | Freq: Once | CUTANEOUS | Status: DC
  Filled 2019-01-07: qty 1

## 2019-01-07 MED ORDER — FENTANYL CITRATE (PF) 100 MCG/2ML IJ SOLN
INTRAMUSCULAR | Status: AC
  Filled 2019-01-07: qty 2

## 2019-01-07 MED ORDER — FENTANYL CITRATE (PF) 100 MCG/2ML IJ SOLN
INTRAMUSCULAR | Status: DC | PRN
  Administered 2019-01-07: 12.5 ug via INTRAVENOUS
  Administered 2019-01-07: 25 ug via INTRAVENOUS
  Administered 2019-01-07: 12.5 ug via INTRAVENOUS

## 2019-01-07 MED ORDER — PHENYLEPHRINE HCL 0.25 % NA SOLN
1.0000 | Freq: Four times a day (QID) | NASAL | Status: DC | PRN
  Filled 2019-01-07: qty 15

## 2019-01-07 MED ORDER — BUTAMBEN-TETRACAINE-BENZOCAINE 2-2-14 % EX AERO
1.0000 | INHALATION_SPRAY | Freq: Once | CUTANEOUS | Status: DC
  Filled 2019-01-07: qty 5

## 2019-01-07 MED ORDER — MIDAZOLAM HCL 2 MG/2ML IJ SOLN
INTRAMUSCULAR | Status: AC
  Filled 2019-01-07: qty 6

## 2019-01-07 NOTE — Plan of Care (Signed)

## 2019-01-07 NOTE — Plan of Care (Signed)
  Problem: Education: Goal: Knowledge of General Education information will improve Description: Including pain rating scale, medication(s)/side effects and non-pharmacologic comfort measures Outcome: Progressing   Problem: Health Behavior/Discharge Planning: Goal: Ability to manage health-related needs will improve Outcome: Progressing   Problem: Clinical Measurements: Goal: Will remain free from infection Outcome: Progressing   Problem: Clinical Measurements: Goal: Respiratory complications will improve Outcome: Not Progressing Note: Patient still requiring 2L of oxygen upon ambulation.

## 2019-01-07 NOTE — Progress Notes (Signed)
[facility] - [place] at [facility]                                                                                                                                                                                  Patient Demographics   Christine Mullins, is a 73 y.o. female, DOB - 1946-04-27, AYT:[id number]  Admit date - 01/06/2019   Admitting Physician Demetrios Loll, MD  Outpatient Primary MD for the patient is Einar Pheasant, MD   LOS - 1  Subjective: Patient admitted with shortness of breath no further work-up for bilateral pulmonary opacities infiltrate Patient complains of cough and shortness of breath  Review of Systems:   CONSTITUTIONAL: No documented fever. No fatigue, weakness. No weight gain, no weight loss.  EYES: No blurry or double vision.  ENT: No tinnitus. No postnasal drip. No redness of the oropharynx.  RESPIRATORY: No cough, no wheeze, no hemoptysis.  Positive dyspnea.  CARDIOVASCULAR: No chest pain. No orthopnea. No palpitations. No syncope.  GASTROINTESTINAL: No nausea, no vomiting or diarrhea. No abdominal pain. No melena or hematochezia.  GENITOURINARY: No dysuria or hematuria.  ENDOCRINE: No polyuria or nocturia. No heat or cold intolerance.  HEMATOLOGY: No anemia. No bruising. No bleeding.  INTEGUMENTARY: No rashes. No lesions.  MUSCULOSKELETAL: No arthritis. No swelling. No gout.  NEUROLOGIC: No numbness, tingling, or ataxia. No seizure-type activity.  PSYCHIATRIC: No anxiety. No insomnia. No ADD.    Vitals:   Vitals:   01/07/19 1351 01/07/19 1400 01/07/19 1423 01/07/19 1424  BP:   99/64 (!) 103/59  Pulse:   87 85  Resp:  20 18   Temp:   97.7 F (36.5 C)   TempSrc:   Oral   SpO2: 92%  93% 94%  Weight:      Height:        Wt Readings from Last 3 Encounters:  01/07/19 65 kg  01/06/19 65.2 kg  12/25/18 61.2 kg     Intake/Output Summary (Last 24 hours) at 01/07/2019 1544 Last data filed at 01/07/2019 1241 Gross per 24 hour  Intake  648.72 ml  Output 950 ml  Net -301.28 ml    Physical Exam:   GENERAL: Pleasant-appearing in no apparent distress.  HEAD, EYES, EARS, NOSE AND THROAT: Atraumatic, normocephalic. Extraocular muscles are intact. Pupils equal and reactive to light. Sclerae anicteric. No conjunctival injection. No oro-pharyngeal erythema.  NECK: Supple. There is no jugular venous distention. No bruits, no lymphadenopathy, no thyromegaly.  HEART: Regular rate and rhythm,. No murmurs, no rubs, no clicks.  LUNGS: Rhonchus breath sounds bilaterally ABDOMEN: Soft, flat, nontender, nondistended. Has good bowel sounds. No hepatosplenomegaly appreciated.  EXTREMITIES: No evidence of any  cyanosis, clubbing, or peripheral edema.  +2 pedal and radial pulses bilaterally.  NEUROLOGIC: The patient is alert, awake, and oriented x3 with no focal motor or sensory deficits appreciated bilaterally.  SKIN: Moist and warm with no rashes appreciated.  Psych: Not anxious, depressed LN: No inguinal LN enlargement    Antibiotics   Anti-infectives (From admission, onward)   None      Medications   Scheduled Meds: . acidophilus  1 capsule Oral Daily  . enoxaparin (LOVENOX) injection  40 mg Subcutaneous Q24H  . escitalopram  10 mg Oral Daily  . pantoprazole  40 mg Oral Daily  . simvastatin  40 mg Oral Daily  . sodium chloride flush  3 mL Intravenous Q12H   Continuous Infusions: . sodium chloride     PRN Meds:.sodium chloride, acetaminophen **OR** acetaminophen, albuterol, bisacodyl, diphenhydrAMINE, guaiFENesin-dextromethorphan, HYDROcodone-acetaminophen, ondansetron **OR** ondansetron (ZOFRAN) IV, senna-docusate, sodium chloride flush, zolpidem   Data Review:   Micro Results Recent Results (from the past 240 hour(s))  Novel Coronavirus, NAA (hospital order; send-out to ref lab)     Status: None   Collection Time: 01/03/19  2:08 PM   Specimen: Nasopharyngeal Swab; Respiratory  Result Value Ref Range Status    SARS-CoV-2, NAA NOT DETECTED NOT DETECTED Final    Comment: (NOTE) This test was developed and its performance characteristics determined by [employer]. This test has not been FDA cleared or approved. This test has been authorized by FDA under an Emergency Use Authorization (EUA). This test is only authorized for the duration of time the declaration that circumstances exist justifying the authorization of the emergency use of in vitro diagnostic tests for detection of SARS-CoV-2 virus and/or diagnosis of COVID-19 infection under section 564(b)(1) of the Act, 21 U.S.C. 010UVO-5(D)(6), unless the authorization is terminated or revoked sooner. When diagnostic testing is negative, the possibility of a false negative result should be considered in the context of a patient's recent exposures and the presence of clinical signs and symptoms consistent with COVID-19. An individual without symptoms of COVID-19 and who is not shedding SARS-CoV-2 virus would expect to have a negative (not detected) result in this assay. Performed  At: [facility] [address] [place], [place] [id number] Rush Farmer MD VZ:[id number]    [place]  Final    Comment: Performed at [facility], [address] [place], [place] [address]    Radiology Reports Dg Chest 2 View  Result Date: 01/07/2019 CLINICAL DATA:  Follow-up pneumonia EXAM: CHEST - 2 VIEW COMPARISON:  12/31/18 FINDINGS: Cardiac shadow is within normal limits. The lungs are well aerated bilaterally. Bilateral infiltrates are noted similar to that seen on the prior CT examination with a more consolidative component on the right in the lower lobe. No sizable effusion is seen. No bony abnormality is noted. IMPRESSION: Bilateral infiltrates right greater than left. Electronically Signed   By: Inez Catalina M.D.   On: 01/07/2019 08:40   Ct Chest Wo Contrast  Result Date: 01/01/2019 CLINICAL DATA:   Cough, shortness of breath for 9 months, follow-up abnormal PE study EXAM: CT CHEST WITHOUT CONTRAST TECHNIQUE: Multidetector CT imaging of the chest was performed following the standard protocol without IV contrast. COMPARISON:  11/06/2018 FINDINGS: Cardiovascular: Aortic atherosclerosis. Three-vessel coronary artery calcifications. Normal heart size. No pericardial effusion. Mediastinum/Nodes: No enlarged mediastinal, hilar, or axillary lymph nodes. Thyroid gland, trachea, and esophagus demonstrate no significant findings. Lungs/Pleura: There is multifocal, very dense, predominantly subpleural ground-glass and consolidative opacity present bilaterally, worst  in right lower lobe with near-total consolidation. Findings are significantly worsened, more confluent and dense compared to prior examination dated 11/06/2018, particularly as noted in the right lower lobe. There is mild underlying fibrosis featuring reticular interstitial pulmonary opacity noted in the aerated dependent left lung. There is minimal paraseptal emphysema. Small right pleural effusion, similar to prior examination Upper Abdomen: No acute abnormality.  Colonic diverticulosis. Musculoskeletal: No chest wall mass or suspicious bone lesions identified. IMPRESSION: 1. There is multifocal, very dense, predominantly subpleural ground-glass and consolidative opacity present bilaterally, worst in right lower lobe with near-total consolidation. Findings are significantly worsened, more confluent and dense compared to prior examination dated 11/06/2018, particularly as noted in the right lower lobe. Small right pleural effusion, similar to prior examination. Overall constellation of findings is most consistent with organizing pneumonia with likely underlying etiologies including infection, systemic inflammatory disorder, or drug toxicity. Findings are not typical for COVID-19, although this remains a differential consideration given this pattern of  airspace disease. Prior CT examination was negative for pulmonary embolism, which generally excludes pulmonary infarction. 2. There is mild underlying fibrosis featuring reticular interstitial pulmonary opacity noted in the aerated dependent left lung. 3.  There is minimal paraseptal emphysema. 4.  Coronary artery disease and aortic atherosclerosis. Electronically Signed   By: Eddie Candle M.D.   On: 01/01/2019 09:02   Dg Chest Port 1 View  Result Date: 01/07/2019 CLINICAL DATA:  Status post bronchoscopy. EXAM: PORTABLE CHEST 1 VIEW COMPARISON:  Radiographs of January 06, 2019. FINDINGS: The heart size and mediastinal contours are within normal limits. No pneumothorax is noted. Stable large bilateral lung opacities are noted, right greater than left, consistent with pneumonia. The visualized skeletal structures are unremarkable. IMPRESSION: Stable bilateral lung opacities as described above, consistent with pneumonia. Electronically Signed   By: Marijo Conception M.D.   On: 01/07/2019 14:45   Dg C-arm 1-60 Min  Result Date: 01/07/2019 CLINICAL DATA:  Bronchoscopies. FLUOROSCOPY TIME:  2 minutes and 51 seconds. Images: Eleven EXAM: DG C-ARM 61-120 MIN COMPARISON:  None. FINDINGS: Eleven images were obtained during a right-sided bronchoscopy. IMPRESSION: Eleven images were obtained during a right-sided bronchoscopy. Electronically Signed   By: Dorise Bullion III M.D   On: 01/07/2019 13:57     CBC Recent Labs  Lab 01/06/19 1528 01/07/19 0335  WBC 10.7* 10.5  HGB 14.6 13.1  HCT 42.3 38.4  PLT 330 279  MCV 86.0 86.9  MCH 29.7 29.6  MCHC 34.5 34.1  RDW 13.4 13.3  LYMPHSABS 1.6  --   MONOABS 1.2*  --   EOSABS 0.1  --   BASOSABS 0.1  --     Chemistries  Recent Labs  Lab 01/06/19 1528 01/06/19 2002 01/07/19 0335  NA 135  --  138  K 3.2*  --  3.5  CL 102  --  104  CO2 22  --  25  GLUCOSE 109*  --  106*  BUN 18  --  16  CREATININE 0.72  --  0.72  CALCIUM 9.5  --  8.8*  MG  --  2.2  --    AST 18  --   --   ALT 12  --   --   ALKPHOS 101  --   --   BILITOT 0.5  --   --    ------------------------------------------------------------------------------------------------------------------ estimated creatinine clearance is 58 mL/min (by C-G formula based on SCr of 0.72 mg/dL). ------------------------------------------------------------------------------------------------------------------ No results for input(s): HGBA1C in the last 72  hours. ------------------------------------------------------------------------------------------------------------------ No results for input(s): CHOL, HDL, LDLCALC, TRIG, CHOLHDL, LDLDIRECT in the last 72 hours. ------------------------------------------------------------------------------------------------------------------ No results for input(s): TSH, T4TOTAL, T3FREE, THYROIDAB in the last 72 hours.  Invalid input(s): FREET3 ------------------------------------------------------------------------------------------------------------------ No results for input(s): VITAMINB12, FOLATE, FERRITIN, TIBC, IRON, RETICCTPCT in the last 72 hours.  Coagulation profile No results for input(s): INR, PROTIME in the last 168 hours.  No results for input(s): DDIMER in the last 72 hours.  Cardiac Enzymes No results for input(s): CKMB, TROPONINI, MYOGLOBIN in the last 168 hours.  Invalid input(s): CK ------------------------------------------------------------------------------------------------------------------ Invalid input(s): [place]  Patient 73 year old presenting with shortness of breath   Acute respiratory failure with hypoxia due to extensive bilateral chronic pulmonary infiltrates.  Seen by pulmonary status post bronchoscopy further management based on bronchoscopy  Hypokalemia.  Potassium supplement and follow-up level.  Hypertension.  Blood pressure is labile below earlier were holding her blood pressure  medication  Hyperlipidemia continue simvastatin  Miscellaneous SCDs for DVT prophylaxis      Code Status Orders  (From admission, onward)         Start     Ordered   01/06/19 1953  Full code  Continuous     01/06/19 1952        Code Status History    This patient has a current code status but no historical code status.   Advance Care Planning Activity    Advance Directive Documentation     Most Recent Value  Type of Advance Directive  Healthcare Power of Attorney, Living will  Pre-existing out of facility DNR order (yellow form or pink MOST form)  -  "MOST" Form in Place?  -           Consults pulmonary critical care  DVT ProphylaxisSCDs Lab Results  Component Value Date   PLT 279 01/07/2019     Time Spent in minutes   30min  Greater than 50% of time spent in care coordination and counseling patient regarding the condition and plan of care.   Dustin Flock M.D on 01/07/2019 at 3:44 PM  Between 7am to 6pm - Pager - [phone]  After 6pm go to [email or web address] - Proofreader  [facility]   Office  [phone]

## 2019-01-07 NOTE — Progress Notes (Signed)
Patient back from bronchoscopy.Patient alert and oriented x4. Vital signs taken. BP low (see flowsheet ). Dr. Posey Pronto made aware, new orders in. Patient can eat/ drink after 1545. Will wait to give medications untill then. Patient ordered dinner that will be brought up at 1600.

## 2019-01-07 NOTE — OR Nursing (Addendum)
Bronch end time 1342, fluoroscopy time 53min 56sec. Patien will be recovered in OR and then will return to the floor  Medications given by Veryl Speak RN.

## 2019-01-07 NOTE — Procedures (Signed)
Indication:   Progressive extensive bilateral ground glass and alveolar infiltrates  Premedication:   Fentanyl 25 + 12.5 + 12.5 mcg Midaz 2 + 1 mg  Anesthesia: Topical to nose and throat 40 cc of 1% lidocaine used during the course of procedure  Procedure: After adequate sedation and anesthesia, the bronchoscope was introduced via the R naris and advanced into the posterior pharynx. Further anesthesia was obtained with 1% lidocaine and the scope was advanced into the trachea. Complete airway anesthesia was achieved with 1% lidocaine and a thorough airway examination was performed. This revealed the following findings:  Findings:  Upper airway -normal Tracheobronchial anatomy -normal Bronchial mucosa -mildly bronchitic Other -moderate frothy secretions throughout.  No endobronchial tumors, masses or foreign bodies  Specimens:   BAL from RML Cytology brushings from RML Transbronchial biopsy (with flouroscopic guidance) from RML  The RML BAL returning fluid was very cloudy/opaque.   Complications: Moderate cough  Post procedure evaluation:  The patient tolerated the procedure well with no major complications CXR -pending   Merton Border, MD PCCM service Mobile [phone] Pager [phone]  01/07/2019 2:29 PM

## 2019-01-07 NOTE — Progress Notes (Signed)
Please see bronchoscopy note.  Patient tolerated procedure well.  Based on clinical presentation, radiographic picture and bronchoscopy findings my leading concerns in the differential diagnosis are  1) bronchoalveolar cell (lepidic) carcinoma and  2) pulmonary alveolar proteinosis  It seems far less likely that these slowly progressive (and now extensive) infiltrates are due to atypical bacterial infection.  Atypical infections likewise seem unlikely.  Infiltrative noninfectious inflammatory pneumonitis also seems unlikely in the absence of any systemic symptoms to suggest a connective tissue disorder.  Sarcoidosis would be highly unusual as a new diagnosis in a woman of her age.  From pulmonary perspective, if home oxygen therapy has been arranged she may be discharged 7/1 and I will arrange follow-up depending on results of bronchoscopy.  I will communicate bronchoscopic findings with patient's family.  Merton Border, MD PCCM service Mobile [phone] Pager [phone] 01/07/2019 2:32 PM

## 2019-01-07 NOTE — Progress Notes (Signed)
Patient declined phone call to family member for update. Stated the MD has already called her son.

## 2019-01-07 NOTE — Interval H&P Note (Signed)
History and Physical Interval Note:  01/07/2019 1:01 PM  Christine Mullins  has presented today for surgery, with the diagnosis of RECURRENT PHEUMONIA.  The various methods of treatment have been discussed with the patient and family. After consideration of risks, benefits and other options for treatment, the patient has consented to  Procedure(s): FLEXIBLE BRONCHOSCOPY, RIGHT (Right) as a surgical intervention.  The patient's history has been reviewed, patient examined, no change in status, stable for surgery.  I have reviewed the patient's chart and labs.  Questions were answered to the patient's satisfaction.    Merton Border, MD PCCM service Mobile [phone] Pager [phone] 01/07/2019 1:01 PM

## 2019-01-07 NOTE — Plan of Care (Signed)
Pt. is status post bronchoscopy. Continues to have intermittent episodes of shortness of breath accompanied by coughing. Currently on 2L of O2.  No other complaints voiced at this time.

## 2019-01-08 ENCOUNTER — Other Ambulatory Visit: Payer: Self-pay | Admitting: Pathology

## 2019-01-08 DIAGNOSIS — C3491 Malignant neoplasm of unspecified part of right bronchus or lung: Secondary | ICD-10-CM

## 2019-01-08 DIAGNOSIS — J9600 Acute respiratory failure, unspecified whether with hypoxia or hypercapnia: Secondary | ICD-10-CM

## 2019-01-08 DIAGNOSIS — C3492 Malignant neoplasm of unspecified part of left bronchus or lung: Secondary | ICD-10-CM

## 2019-01-08 DIAGNOSIS — J4520 Mild intermittent asthma, uncomplicated: Secondary | ICD-10-CM

## 2019-01-08 LAB — CYTOLOGY - NON PAP

## 2019-01-08 LAB — SURGICAL PATHOLOGY

## 2019-01-08 LAB — PNEUMOCYSTIS SMEAR, DFA: Pneumocystis Smear, DFA: NEGATIVE

## 2019-01-08 LAB — HIV ANTIBODY (ROUTINE TESTING W REFLEX): HIV Screen 4th Generation wRfx: NONREACTIVE

## 2019-01-08 LAB — ACID FAST SMEAR (AFB, MYCOBACTERIA): Acid Fast Smear: NEGATIVE

## 2019-01-08 NOTE — Progress Notes (Signed)
SATURATION QUALIFICATIONS: (This note is used to comply with regulatory documentation for home oxygen)  Patient Saturations on Room Air at Rest =88%  Patient Saturations on Room Air while Ambulating = n/a%  Patient Saturations on  Liters of oxygen while Ambulating =   Please briefly explain why patient needs home oxygen:

## 2019-01-08 NOTE — Consult Note (Signed)
[facility]     [address], [address]     [place], [place] [address]     Phone: [phone]      Fax: [phone]        Date of admission:  01/06/2019  Inpatient day:  01/08/2019  Consulting physician:  Dr. Merton Border   Reason for Consultation:  Lung cancer  Chief Complaint: Christine Mullins is a 73 y.o. female with bilateral infiltrates and respiratory failure who was admitted for bronchoscopy.   HPI:  The patient notes a very brief history of smoking in college.  She began to have symptoms about a year ago.  She describes a "bad cold" in 01/2018 that went on for weeks.  CXR on 01/28/2018 revealed restive diffuse interstitial prominence throughout both lungs in a reticular nodular pattern just above chronic lung disease.  She was referred to Dr. Mortimer Fries on 02/09/2019.  She was prescribed a course of antibiotics.  She states that her symptoms initially got better but then waxed and waned.  CXR on 07/30/2018 revealed posterior right lower lobe airspace disease suspicious for pneumonia.  She was again treated with antibiotics and steroids.  Symptoms appeared to improve, but then worsen.  She developed progressive shortness of breath with cough and mucus production in 09/2018.  She noted one episode of emesis in 08/2018 after a prolonged coughing spell.  Chest CT angiogram on 11/06/2018 revealed no pulmonary embolism, but extensive airspace consolidation in the RLL consistent with pneumonia.  There were multiple ill-defined nodular opacities throughout the lungs bilaterally and nearly every lobe.  She was treated with a another course of antibiotics and steroids.  Chest CT on 12/31/2018 revealed multifocal, very dense, predominantly subpleural groundglass and consolidative opacity bilaterally, worse in the right lower lobe with near-total consolidation. There was a small right pleural effusion.  Findings were worse as compared to 11/06/2018.   Pulmonary function  testing on 01/01/2019 were unable to be performed because of dyspnea.  Six minute walk revealed a desaturation to 89%.  COVID-19 testing x 3 was negative.  Bronchoscopy on 01/07/2019 by Dr. Merton Border revealed moderate frothy secretions throughout.  There were no endobronchial tumors, masses or foreign bodies.  BAL and cytology was sent from the right middle lobe.  Pathology confirmed lepidic adenocarcinoma.  Symptomatically, she notes shortness of breath with minimal exertion.  She has worsened significantly in the past 3 weeks.  She has a chronic cough productive of moderate mucus.  Mucus was was described as initially thick and colored and now copious and thinner.  She lost 15 pounds between 03/2018 - 07/2018.  She is gained back 6 pounds with improvement in her appetite.   Past Medical History:  Diagnosis Date   Arthritis    Depression    GERD (gastroesophageal reflux disease)    Hx of diverticulitis of colon    Hyperlipidemia    Hypertension     Past Surgical History:  Procedure Laterality Date   BREAST BIOPSY Left 1995   negative   CATARACT EXTRACTION, BILATERAL     CESAREAN SECTION     COLONOSCOPY WITH PROPOFOL N/A 05/15/2017   Procedure: COLONOSCOPY WITH PROPOFOL;  Surgeon: Lin Landsman, MD;  Location: [facility] ENDOSCOPY;  Service: Gastroenterology;  Laterality: N/A;   FLEXIBLE BRONCHOSCOPY Right 01/07/2019   Procedure: FLEXIBLE BRONCHOSCOPY, RIGHT;  Surgeon: Wilhelmina Mcardle, MD;  Location: [facility] ORS;  Service: Pulmonary;  Laterality: Right;   FOOT SURGERY Bilateral  GANGLION CYST EXCISION Left 1965   wrist   GANGLION CYST EXCISION Right 1980   hand   HYSTEROSCOPY W/D&C N/A 04/30/2017   Procedure: DILATATION AND CURETTAGE /HYSTEROSCOPY;  Surgeon: Defrancesco, Alanda Slim, MD;  Location: [facility] ORS;  Service: Gynecology;  Laterality: N/A;   KNEE ARTHROSCOPY Right 2000    Family History  Problem Relation Age of Onset   Arthritis Mother     Hyperlipidemia Mother    Peripheral Artery Disease Mother    Hyperlipidemia Father    Stroke Father    Breast cancer Neg Hx    Ovarian cancer Neg Hx    Colon cancer Neg Hx     Social History:  reports that she has never smoked. She has never used smokeless tobacco. She reports current alcohol use. She reports that she does not use drugs.  She has been single for 34-35 years.  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  She has traveled around the world.  She denies any exposure to radiations or toxins.  Her medical power of attorney is her son, Meigan Pates.  She has a Arts administrator.  She lives alone in [place].  Allergies: No Known Allergies  Medications Prior to Admission  Medication Sig Dispense Refill   aspirin EC 81 MG tablet Take 1 tablet (81 mg total) by mouth daily. 90 tablet 3   benazepril (LOTENSIN) 10 MG tablet TAKE 1 TABLET BY MOUTH DAILY 90 tablet 1   diphenhydrAMINE (BENADRYL) 25 mg capsule Take 25 mg by mouth daily as needed for allergies.     escitalopram (LEXAPRO) 10 MG tablet TAKE 1 TABLET BY MOUTH DAILY 30 tablet 1   hydrochlorothiazide (HYDRODIURIL) 25 MG tablet TAKE ONE TABLET BY MOUTH DAILY 90 tablet 1   omeprazole (PRILOSEC) 40 MG capsule Take 40 mg by mouth daily.      Probiotic Product (PROBIOTIC DAILY PO) Take 1 capsule by mouth daily.     simvastatin (ZOCOR) 40 MG tablet Take 40 mg by mouth daily at 6 PM.      zolpidem (AMBIEN) 10 MG tablet TAKE 1/2 TABLET BY MOUTH AT BEDTIME AS NEEDED FOR SLEEP 30 tablet 0    Review of Systems: GENERAL:  Fatigue.  No fevers, sweats.  Weight loss loss of 15 pounds, but up 6 pounds since 07/2018. PERFORMANCE STATUS (ECOG):  2 HEENT:  No visual changes, runny nose, sore throat, mouth sores or tenderness. Lungs: Shortness of breath.  Cough with significant mucus production.  Hemoptysis x 1 08/2018. Cardiac:  No chest pain, palpitations, orthopnea, or PND. GI:  Appetite  improved.  No nausea, vomiting, diarrhea, constipation, melena or hematochezia. GU:  No urgency, frequency, dysuria, or hematuria. Musculoskeletal:  No back pain.  Arthritis in left thumb.  S/p right knee arthroscopic surgery in 2000.  No muscle tenderness. Extremities:  No pain or swelling. Skin:  No rashes or skin changes. Neuro:  No headache, numbness or weakness, balance or coordination issues. Endocrine:  No diabetes, thyroid issues, hot flashes or night sweats. Psych:  No mood changes, depression or anxiety. Pain:  No focal pain. Review of systems:  All other systems reviewed and found to be negative.  Physical Exam:  Blood pressure (!) 109/57, pulse 85, temperature 97.7 F (36.5 C), temperature source Oral, resp. rate 18, height _0  (1.575 m), weight 143 lb 4.8 oz (65 kg), SpO2 96 %.  GENERAL:  Well developed, well nourished, woman sitting comfortably on the  medical unit in no acute distress.  She pauses periodically when speaking secondary to SOB. MENTAL STATUS:  Alert and oriented to person, place and time. HEAD:  Short styled gray hair.  Normocephalic, atraumatic, face symmetric, no Cushingoid features. EYES:  Blue eyes.  Pupils equal round and reactive to light and accomodation.  No conjunctivitis or scleral icterus. ENT:  [place] in place.  Oropharynx clear without lesion.  Tongue normal. Mucous membranes moist.  RESPIRATORY:  Coarse breath sounds bilateral lower lobes (right > left).  Intermittent right squeak.  No wheezes. CARDIOVASCULAR:  Regular rate and rhythm without murmur, rub or gallop. ABDOMEN:  Soft, non-tender, with active bowel sounds, and no hepatosplenomegaly.  No masses. SKIN:  No rashes, ulcers or lesions. EXTREMITIES: No edema, no skin discoloration or tenderness.  No palpable cords. LYMPH NODES: No palpable cervical, supraclavicular, axillary or inguinal adenopathy  NEUROLOGICAL: Unremarkable. PSYCH:  Appropriate.   Results for orders placed or performed  during the hospital encounter of 01/06/19 (from the past 48 hour(s))  Basic metabolic panel     Status: Abnormal   Collection Time: 01/07/19  3:35 AM  Result Value Ref Range   Sodium 138 135 - 145 mmol/L   Potassium 3.5 3.5 - 5.1 mmol/L   Chloride 104 98 - 111 mmol/L   CO2 25 22 - 32 mmol/L   Glucose, Bld 106 (H) 70 - 99 mg/dL   BUN 16 8 - 23 mg/dL   Creatinine, Ser 0.72 0.44 - 1.00 mg/dL   Calcium 8.8 (L) 8.9 - 10.3 mg/dL   GFR calc non Af Amer >60 >60 mL/min   GFR calc Af Amer >60 >60 mL/min   Anion gap 9 5 - 15    Comment: Performed at [facility], [address] [place],  [address]  CBC     Status: None   Collection Time: 01/07/19  3:35 AM  Result Value Ref Range   WBC 10.5 4.0 - 10.5 K/uL   RBC 4.42 3.87 - 5.11 MIL/uL   Hemoglobin 13.1 12.0 - 15.0 g/dL   HCT 38.4 36.0 - 46.0 %   MCV 86.9 80.0 - 100.0 fL   MCH 29.6 26.0 - 34.0 pg   MCHC 34.1 30.0 - 36.0 g/dL   RDW 13.3 11.5 - 15.5 %   Platelets 279 150 - 400 K/uL   nRBC 0.0 0.0 - 0.2 %    Comment: Performed at [facility], [address] [place],  [address]  Cytology - Non PAP; RML     Status: None   Collection Time: 01/07/19  1:47 PM  Result Value Ref Range   CYTOLOGY - NON GYN      Cytology - Non PAP CASE: [id number] PATIENT: Christine Mullins Non-Gyn Cytology Report     SPECIMEN SUBMITTED: A. Lung, right middle lobe; lavage  CLINICAL HISTORY: 73 year old female with worsening of shortness of breath and cough over past 3 weeks; chest CT with bilateral, multifocal, dense, predominantly subpleural ground-glass and consolidative opacities; never smoker  PRE-OPERATIVE DIAGNOSIS: Progressive extensive bilateral ground glass and alveolar infiltrates  POST-OPERATIVE DIAGNOSIS: Same as above     DIAGNOSIS: A. LUNG, RIGHT MIDDLE LOBE; BRONCHSCOPY WITH BAL: - POSITIVE FOR MALIGNANCY. - ADENOCARCINOMA IS PRESENT.  See concurrent cases [id number] and  [id number].  Comment: These findings were communicated to Dr. Alva Garnet via [facility] secure text on 01/08/2019.  GROSS DESCRIPTION: A. Site: Right middle lobe Procedure: Bronchoscopy Cytotechnologist: Ashlee Howze-Soremekun and Molli Barrows Specimen labeled: Right middle  lobe, BAL                       Volume: 16 mL                      Description: Clear to slightly cloudy fluid admixed with minimal, possible wispy tissue fragments in a BAL collection device                      Submitted for: ThinPrep and cell block   Final Diagnosis performed by Quay Burow, MD.   Electronically signed 01/08/2019 2:26:11PM The electronic signature indicates that the named Attending Pathologist has evaluated the specimen  Technical component performed at [facility], [address], [place], [place] [address] Lab: [phone] Dir: Rush Farmer, MD, MMM  Professional component performed at [facility], [facility], [place], [place], [place] [address] Lab: [phone] Dir: Dellia Nims. Reuel Derby, MD   Surgical pathology     Status: None   Collection Time: 01/07/19  1:47 PM  Result Value Ref Range   SURGICAL PATHOLOGY      Surgical Pathology CASE: [id number] PATIENT: Christine Mullins Surgical Pathology Report     SPECIMEN SUBMITTED: A. Lung, right middle lobe  CLINICAL HISTORY: 73 year old female with worsening of shortness of breath and cough over past 3 weeks; chest CT with bilateral, multifocal, dense, predominantly subpleural ground-glass and consolidative opacities; never smoker  PRE-OPERATIVE DIAGNOSIS: Progressive extensive bilateral ground glass and alveolar infiltrates  POST-OPERATIVE DIAGNOSIS: Same as above     DIAGNOSIS: A.  LUNG, RIGHT MIDDLE LOBE; TRANSBRONCHIAL BIOPSY: - ADENOCARCINOMA, LEPIDIC PATTERN (IASLC / ATS / ERS CLASSIFICATION).  There is sufficient material for molecular testing.  See concurrent cases [id number] and -[id number].  Comment: While a  lepidic pattern without definitive invasion is identified in this sample an invasive component cannot be excluded. These findings were communicated to Dr. Alva Garnet via [facility] secure text on 01/08/2019.  GROSS DESCRIP TION: A. Labeled: Right middle lobe, bronch biopsy Received: The specimen is retrieved via bronchoscopy and placed into formalin. Tissue fragment(s): Multiple Size: Aggregate, 1.1 x 0.4 x 0.1 cm Description: Received are irregular fragments of tan-pink soft tissue. At the time of procedure, 4 Diff-Quik stained slides are performed.  The remainder of the specimen is filtered into a mesh bag and submitted entirely for routine histology in cassette 1.     Final Diagnosis performed by Quay Burow, MD.   Electronically signed 01/08/2019 11:10:13AM The electronic signature indicates that the named Attending Pathologist has evaluated the specimen  Technical component performed at [facility], [address], [place], [place] [address] Lab: [phone] Dir: Rush Farmer, MD, MMM  Professional component performed at [facility], [facility], [place], [place],  [address] Lab: [phone] Dir: Dellia Nims. Reuel Derby, MD   Cytology - Non PAP; RML     Status: None   Collection Time: 01/07/19  1:54 PM  Result Value Ref Range   CYTOLOGY - NON GYN      Cytology - Non PAP CASE: [id number] PATIENT: Christine Mullins Non-Gyn Cytology Report     SPECIMEN SUBMITTED: A. Lung, right middle lobe; brushing  CLINICAL HISTORY: 73 year old female with worsening of shortness of breath and cough over past 3 weeks; chest CT with bilateral, multifocal, dense, predominantly subpleural ground-glass and consolidative opacities; never smoker  PRE-OPERATIVE DIAGNOSIS: Progressive extensive bilateral ground glass and alveolar infiltrates  POST-OPERATIVE DIAGNOSIS: Same as above  DIAGNOSIS: A. LUNG, RIGHT MIDDLE LOBE; BRONCHSCOPY WITH BRUSHING: - POSITIVE FOR  MALIGNANCY. - ADENOCARCINOMA IS PRESENT.  See concurrent cases [id number] and [id number].  Comment: These findings were communicated to Dr. Alva Garnet via [facility] secure text on 01/08/2019.  GROSS DESCRIPTION: A. Site: Right middle lobe Procedure: Bronchoscopy Cytotechnologist: Ashlee Howze-Soremekun and Molli Barrows Specimen(s) collected: 4 Diff Quik stained slides Spec imen labeled: Right middle lobe, brush      Volume: 15 mL            Description: Clear CytoLyt solution with minimal, wispy tissue fragments and 1   collection brush Submitted for: ThinPrep and cell block   Final Diagnosis performed by Quay Burow, MD.   Electronically signed 01/08/2019 2:31:41PM The electronic signature indicates that the named Attending Pathologist has evaluated the specimen  Technical component performed at [facility], [address] [place], [place] [address] Lab: [phone] Dir: Rush Farmer, MD, MMM  Professional component performed at [facility], [facility], [place], [place], [place] [address] Lab: [phone] Dir: Dellia Nims. Rubinas, MD   Acid Fast Smear (AFB)     Status: None   Collection Time: 01/07/19  2:39 PM   Specimen: Bronchoalveolar Lavage  Result Value Ref Range   AFB Specimen Processing Concentration    Acid Fast Smear Negative     Comment: (NOTE) Performed At: [facility] [place], [place] [id number] Rush Farmer MD [id number]    Source (AFB) BRONCHIAL ALVEOLAR LAVAGE     Comment: Performed at [facility], [address] [place], [place] [address]  Culture, respiratory (non-expectorated)     Status: None (Preliminary result)   Collection Time: 01/07/19  2:39 PM   Specimen: Bronchoalveolar Lavage; Respiratory  Result Value Ref Range   Specimen Description      BRONCHIAL ALVEOLAR LAVAGE Performed at [facility], [address] [place], [place] [address]    Special Requests      BRONCHIAL ALVEOLAR  LAVAGE Performed at [facility], [place], [place] [address]    Gram Stain      FEW WBC PRESENT,BOTH PMN AND MONONUCLEAR NO ORGANISMS SEEN    Culture      NO GROWTH < 24 HOURS Performed at [facility], [place] [address], [place], [place] [address]    Report Status PENDING   Pneumocystis Smear, DFA     Status: None   Collection Time: 01/07/19  2:39 PM  Result Value Ref Range   Pneumocystis Smear, DFA Negative Negative    Comment: (NOTE) Performed At: [facility] [place], [place] [id number] Rush Farmer MD LE:[id number]    Source of Sample BRONCHIAL ALVEOLAR LAVAGE     Comment: Performed at [facility], [address] [place],  [address]   Dg Chest 2 View  Result Date: 01/07/2019 CLINICAL DATA:  Follow-up pneumonia EXAM: CHEST - 2 VIEW COMPARISON:  12/31/18 FINDINGS: Cardiac shadow is within normal limits. The lungs are well aerated bilaterally. Bilateral infiltrates are noted similar to that seen on the prior CT examination with a more consolidative component on the right in the lower lobe. No sizable effusion is seen. No bony abnormality is noted. IMPRESSION: Bilateral infiltrates right greater than left. Electronically Signed   By: Inez Catalina M.D.   On: 01/07/2019 08:40   Dg Chest Port 1 View  Result Date: 01/07/2019 CLINICAL DATA:  Status post bronchoscopy. EXAM: PORTABLE CHEST 1 VIEW COMPARISON:  Radiographs of January 06, 2019. FINDINGS: The heart size and mediastinal contours are within normal limits. No pneumothorax is noted. Stable large bilateral lung opacities are noted, right greater than left, consistent with pneumonia. The visualized skeletal structures are unremarkable. IMPRESSION: Stable bilateral lung opacities as described above, consistent with pneumonia. Electronically Signed   By: Marijo Conception M.D.   On: 01/07/2019 14:45   Dg C-arm 1-60 Min  Result Date: 01/07/2019 CLINICAL DATA:   Bronchoscopies. FLUOROSCOPY TIME:  2 minutes and 51 seconds. Images: Eleven EXAM: DG C-ARM 61-120 MIN COMPARISON:  None. FINDINGS: Eleven images were obtained during a right-sided bronchoscopy. IMPRESSION: Eleven images were obtained during a right-sided bronchoscopy. Electronically Signed   By: Dorise Bullion III M.D   On: 01/07/2019 13:57    Assessment:  The patient is a 73 y.o. woman with lepidic adenocarcinoma of the lung s/p bronchoscopy on 01/07/2019.  She presented with a 1 year history of progressive shortness of breath and increasing productive cough.  Chest CT on 12/31/2018 revealed multifocal, very dense, predominantly subpleural groundglass and consolidative opacity bilaterally, worse in the right lower lobe with near-total consolidation. There was a small right pleural effusion.    Symptomatically, she notes shortness of breath with minimal activity.  She is on oxygen 2 liters/min.  Plan:   1.   Lepidic adenocarcinoma  Discuss current sampling of lung reveals non-invasive lung cancer.  It is unclear if there is a component of invasive lung cancer.  Review of imaging reveals diffuse bilateral disease.  She is not a candidate for surgery or radiation.  PET scan as an outpatient.  Tissue will be sent for mutational testing (Foundation One or Richards).  We discussed treatment secondary to her progressive debilitating disease although no documented invasive component.   Treatment options based on mutational analysis.   Chemotherapy (carboplatin + Alimta or carboplatin + Taxol)   Present at tumor board. 2.  Follow-up in the outpatient department.  She will be contacted with an appointment.    Thank you for allowing me to participate in Christine Mullins 's care.  I will follow /her closely with you while hospitalized and after discharge in the outpatient department.   Lequita Asal, MD  01/08/2019, 9:43 PM

## 2019-01-08 NOTE — Progress Notes (Signed)
Despite the alarming diagnosis, Christine Mullins seems to be handling it well.  She's been talking on the phone with family members and good friends.  Seems to be getting good support.

## 2019-01-08 NOTE — Progress Notes (Signed)
Pathology on bronchoscopy biopsy specimens reveals lepidic adenocarcinoma. I have informed the patient (with her son a part of the conversation over phone) and have requested Oncology consultation.  Pulmonary medicine will sign off. Please call if we can be of further assistance  Merton Border, MD PCCM service Mobile [phone] Pager [phone] 01/08/2019 12:33 PM

## 2019-01-08 NOTE — Plan of Care (Signed)
  Problem: Clinical Measurements: Goal: Will remain free from infection Outcome: Progressing Goal: Cardiovascular complication will be avoided Outcome: Progressing   Problem: Pain Managment: Goal: General experience of comfort will improve Outcome: Progressing   

## 2019-01-08 NOTE — Progress Notes (Signed)
[place] at [facility]                                                                                                                                                                                  Patient Demographics   Christine Mullins, is a 73 y.o. female, DOB - [birth year], FTD:[id number]  Admit date - 01/06/2019   Admitting Physician Demetrios Loll, MD  Outpatient Primary MD for the patient is Einar Pheasant, MD   LOS - 2  Subjective: Patient continues to have shortness of breath and cough biopsy results consistent with adenocarcinoma Review of Systems:   CONSTITUTIONAL: No documented fever. No fatigue, weakness. No weight gain, no weight loss.  EYES: No blurry or double vision.  ENT: No tinnitus. No postnasal drip. No redness of the oropharynx.  RESPIRATORY: No cough, no wheeze, no hemoptysis.  Positive dyspnea.  CARDIOVASCULAR: No chest pain. No orthopnea. No palpitations. No syncope.  GASTROINTESTINAL: No nausea, no vomiting or diarrhea. No abdominal pain. No melena or hematochezia.  GENITOURINARY: No dysuria or hematuria.  ENDOCRINE: No polyuria or nocturia. No heat or cold intolerance.  HEMATOLOGY: No anemia. No bruising. No bleeding.  INTEGUMENTARY: No rashes. No lesions.  MUSCULOSKELETAL: No arthritis. No swelling. No gout.  NEUROLOGIC: No numbness, tingling, or ataxia. No seizure-type activity.  PSYCHIATRIC: No anxiety. No insomnia. No ADD.    Vitals:   Vitals:   01/08/19 0331 01/08/19 0633 01/08/19 0743 01/08/19 1047  BP: (!) 102/55  (!) 107/44   Pulse: 89  85   Resp: _0 Temp: (!) 100.6 F (38.1 C) 98.9 F (37.2 C) 98.2 F (36.8 C)   TempSrc: Oral Oral Oral   SpO2: 92%  93%   Weight:      Height:        Wt Readings from Last 3 Encounters:  01/07/19 65 kg  01/06/19 65.2 kg  12/25/18 61.2 kg     Intake/Output Summary (Last 24 hours) at 01/08/2019 1444 Last data filed at 01/08/2019 0336 Gross per 24 hour  Intake -  Output 300  ml  Net -300 ml    Physical Exam:   GENERAL: Pleasant-appearing in no apparent distress.  HEAD, EYES, EARS, NOSE AND THROAT: Atraumatic, normocephalic. Extraocular muscles are intact. Pupils equal and reactive to light. Sclerae anicteric. No conjunctival injection. No oro-pharyngeal erythema.  NECK: Supple. There is no jugular venous distention. No bruits, no lymphadenopathy, no thyromegaly.  HEART: Regular rate and rhythm,. No murmurs, no rubs, no clicks.  LUNGS: Rhonchus breath sounds bilaterally ABDOMEN: Soft, flat, nontender, nondistended. Has good bowel sounds. No hepatosplenomegaly appreciated.  EXTREMITIES: No evidence of any cyanosis, clubbing,  or peripheral edema.  +2 pedal and radial pulses bilaterally.  NEUROLOGIC: The patient is alert, awake, and oriented x3 with no focal motor or sensory deficits appreciated bilaterally.  SKIN: Moist and warm with no rashes appreciated.  Psych: Not anxious, depressed LN: No inguinal LN enlargement    Antibiotics   Anti-infectives (From admission, onward)   None      Medications   Scheduled Meds: . acidophilus  1 capsule Oral Daily  . enoxaparin (LOVENOX) injection  40 mg Subcutaneous Q24H  . escitalopram  10 mg Oral Daily  . pantoprazole  40 mg Oral Daily  . simvastatin  40 mg Oral Daily  . sodium chloride flush  3 mL Intravenous Q12H   Continuous Infusions: . sodium chloride     PRN Meds:.sodium chloride, acetaminophen **OR** acetaminophen, albuterol, bisacodyl, diphenhydrAMINE, guaiFENesin-dextromethorphan, HYDROcodone-acetaminophen, ondansetron **OR** ondansetron (ZOFRAN) IV, senna-docusate, sodium chloride flush, zolpidem   Data Review:   Micro Results Recent Results (from the past 240 hour(s))  Novel Coronavirus, NAA (hospital order; send-out to ref lab)     Status: None   Collection Time: 01/03/19  2:08 PM   Specimen: Nasopharyngeal Swab; Respiratory  Result Value Ref Range Status   SARS-CoV-2, NAA NOT DETECTED NOT  DETECTED Final    Comment: (NOTE) This test was developed and its performance characteristics determined by [employer]. This test has not been FDA cleared or approved. This test has been authorized by FDA under an Emergency Use Authorization (EUA). This test is only authorized for the duration of time the declaration that circumstances exist justifying the authorization of the emergency use of in vitro diagnostic tests for detection of SARS-CoV-2 virus and/or diagnosis of COVID-19 infection under section 564(b)(1) of the Act, 21 U.S.C. 100FHQ-1(F)(7), unless the authorization is terminated or revoked sooner. When diagnostic testing is negative, the possibility of a false negative result should be considered in the context of a patient's recent exposures and the presence of clinical signs and symptoms consistent with COVID-19. An individual without symptoms of COVID-19 and who is not shedding SARS-CoV-2 virus would expect to have a negative (not detected) result in this assay. Performed  At: [facility] [address] [place], [place] [id number] Rush Farmer MD [id number]    [place]  Final    Comment: Performed at [facility], [address] [place], [place] [address]  Culture, respiratory (non-expectorated)     Status: None (Preliminary result)   Collection Time: 01/07/19  2:39 PM   Specimen: Bronchoalveolar Lavage; Respiratory  Result Value Ref Range Status   Specimen Description   Final    BRONCHIAL ALVEOLAR LAVAGE Performed at [facility], [address], [place], [place] [address]    Special Requests   Final    BRONCHIAL ALVEOLAR LAVAGE Performed at [facility], [place], [place] [address]    Gram Stain   Final    FEW WBC PRESENT,BOTH PMN AND MONONUCLEAR NO ORGANISMS SEEN    Culture   Final    NO GROWTH < 24 HOURS Performed at [facility], [place] [address],  [place],  [address]    Report Status PENDING  Incomplete    Radiology Reports Dg Chest 2 View  Result Date: 01/07/2019 CLINICAL DATA:  Follow-up pneumonia EXAM: CHEST - 2 VIEW COMPARISON:  12/31/18 FINDINGS: Cardiac shadow is within normal limits. The lungs are well aerated bilaterally. Bilateral infiltrates are noted similar to that seen on the prior CT examination with  a more consolidative component on the right in the lower lobe. No sizable effusion is seen. No bony abnormality is noted. IMPRESSION: Bilateral infiltrates right greater than left. Electronically Signed   By: Inez Catalina M.D.   On: 01/07/2019 08:40   Ct Chest Wo Contrast  Result Date: 01/01/2019 CLINICAL DATA:  Cough, shortness of breath for 9 months, follow-up abnormal PE study EXAM: CT CHEST WITHOUT CONTRAST TECHNIQUE: Multidetector CT imaging of the chest was performed following the standard protocol without IV contrast. COMPARISON:  11/06/2018 FINDINGS: Cardiovascular: Aortic atherosclerosis. Three-vessel coronary artery calcifications. Normal heart size. No pericardial effusion. Mediastinum/Nodes: No enlarged mediastinal, hilar, or axillary lymph nodes. Thyroid gland, trachea, and esophagus demonstrate no significant findings. Lungs/Pleura: There is multifocal, very dense, predominantly subpleural ground-glass and consolidative opacity present bilaterally, worst in right lower lobe with near-total consolidation. Findings are significantly worsened, more confluent and dense compared to prior examination dated 11/06/2018, particularly as noted in the right lower lobe. There is mild underlying fibrosis featuring reticular interstitial pulmonary opacity noted in the aerated dependent left lung. There is minimal paraseptal emphysema. Small right pleural effusion, similar to prior examination Upper Abdomen: No acute abnormality.  Colonic diverticulosis. Musculoskeletal: No chest wall mass or suspicious bone lesions identified.  IMPRESSION: 1. There is multifocal, very dense, predominantly subpleural ground-glass and consolidative opacity present bilaterally, worst in right lower lobe with near-total consolidation. Findings are significantly worsened, more confluent and dense compared to prior examination dated 11/06/2018, particularly as noted in the right lower lobe. Small right pleural effusion, similar to prior examination. Overall constellation of findings is most consistent with organizing pneumonia with likely underlying etiologies including infection, systemic inflammatory disorder, or drug toxicity. Findings are not typical for COVID-19, although this remains a differential consideration given this pattern of airspace disease. Prior CT examination was negative for pulmonary embolism, which generally excludes pulmonary infarction. 2. There is mild underlying fibrosis featuring reticular interstitial pulmonary opacity noted in the aerated dependent left lung. 3.  There is minimal paraseptal emphysema. 4.  Coronary artery disease and aortic atherosclerosis. Electronically Signed   By: Eddie Candle M.D.   On: 01/01/2019 09:02   Dg Chest Port 1 View  Result Date: 01/07/2019 CLINICAL DATA:  Status post bronchoscopy. EXAM: PORTABLE CHEST 1 VIEW COMPARISON:  Radiographs of January 06, 2019. FINDINGS: The heart size and mediastinal contours are within normal limits. No pneumothorax is noted. Stable large bilateral lung opacities are noted, right greater than left, consistent with pneumonia. The visualized skeletal structures are unremarkable. IMPRESSION: Stable bilateral lung opacities as described above, consistent with pneumonia. Electronically Signed   By: Marijo Conception M.D.   On: 01/07/2019 14:45   Dg C-arm 1-60 Min  Result Date: 01/07/2019 CLINICAL DATA:  Bronchoscopies. FLUOROSCOPY TIME:  2 minutes and 51 seconds. Images: Eleven EXAM: DG C-ARM 61-120 MIN COMPARISON:  None. FINDINGS: Eleven images were obtained during a  right-sided bronchoscopy. IMPRESSION: Eleven images were obtained during a right-sided bronchoscopy. Electronically Signed   By: Dorise Bullion III M.D   On: 01/07/2019 13:57     CBC Recent Labs  Lab 01/06/19 1528 01/07/19 0335  WBC 10.7* 10.5  HGB 14.6 13.1  HCT 42.3 38.4  PLT 330 279  MCV 86.0 86.9  MCH 29.7 29.6  MCHC 34.5 34.1  RDW 13.4 13.3  LYMPHSABS 1.6  --   MONOABS 1.2*  --   EOSABS 0.1  --   BASOSABS 0.1  --     Chemistries  Recent Labs  Lab 01/06/19 1528 01/06/19 2002 01/07/19 0335  NA 135  --  138  K 3.2*  --  3.5  CL 102  --  104  CO2 22  --  25  GLUCOSE 109*  --  106*  BUN 18  --  16  CREATININE 0.72  --  0.72  CALCIUM 9.5  --  8.8*  MG  --  2.2  --   AST 18  --   --   ALT 12  --   --   ALKPHOS 101  --   --   BILITOT 0.5  --   --    ------------------------------------------------------------------------------------------------------------------ estimated creatinine clearance is 58 mL/min (by C-G formula based on SCr of 0.72 mg/dL). ------------------------------------------------------------------------------------------------------------------ No results for input(s): HGBA1C in the last 72 hours. ------------------------------------------------------------------------------------------------------------------ No results for input(s): CHOL, HDL, LDLCALC, TRIG, CHOLHDL, LDLDIRECT in the last 72 hours. ------------------------------------------------------------------------------------------------------------------ No results for input(s): TSH, T4TOTAL, T3FREE, THYROIDAB in the last 72 hours.  Invalid input(s): FREET3 ------------------------------------------------------------------------------------------------------------------ No results for input(s): VITAMINB12, FOLATE, FERRITIN, TIBC, IRON, RETICCTPCT in the last 72 hours.  Coagulation profile No results for input(s): INR, PROTIME in the last 168 hours.  No results for input(s): DDIMER in  the last 72 hours.  Cardiac Enzymes No results for input(s): CKMB, TROPONINI, MYOGLOBIN in the last 168 hours.  Invalid input(s): CK ------------------------------------------------------------------------------------------------------------------ Invalid input(s): [place]  Patient 73 year old presenting with shortness of breath   Acute respiratory failure with hypoxia due adenocarcinoma the lung, oncology consult has been requested  Hypokalemia.  Potassium supplement and follow-up level.  Recheck in the morning  Hypertension.  Blood pressure is labile below earlier were holding her blood pressure medication  Hyperlipidemia continue simvastatin  Fever per Dr. Jamal Collin this is not uncommon post biopsy continue monitor temperature  Miscellaneous SCDs for DVT prophylaxis      Code Status Orders  (From admission, onward)         Start     Ordered   01/06/19 1953  Full code  Continuous     01/06/19 1952        Code Status History    This patient has a current code status but no historical code status.   Advance Care Planning Activity    Advance Directive Documentation     Most Recent Value  Type of Advance Directive  Healthcare Power of Attorney, Living will  Pre-existing out of facility DNR order (yellow form or pink MOST form)  -  "MOST" Form in Place?  -           Consults pulmonary critical care  DVT ProphylaxisSCDs Lab Results  Component Value Date   PLT 279 01/07/2019     Time Spent in minutes   36mn  Greater than 50% of time spent in care coordination and counseling patient regarding the condition and plan of care.   SDustin FlockM.D on 01/08/2019 at 2:44 PM  Between 7am to 6pm - Pager - [phone]  After 6pm go to [email or web address] - [email or web address]   Office  [phone]

## 2019-01-09 LAB — BASIC METABOLIC PANEL
Anion gap: 10 (ref 5–15)
BUN: 13 mg/dL (ref 8–23)
CO2: 23 mmol/L (ref 22–32)
Calcium: 8.5 mg/dL — ABNORMAL LOW (ref 8.9–10.3)
Chloride: 107 mmol/L (ref 98–111)
Creatinine, Ser: 0.67 mg/dL (ref 0.44–1.00)
GFR calc Af Amer: >60 mL/min (ref >60)
GFR calc non Af Amer: >60 mL/min (ref >60)
Glucose, Bld: 102 mg/dL — ABNORMAL HIGH (ref 70–99)
Potassium: 3.5 mmol/L (ref 3.5–5.1)
Sodium: 140 mmol/L (ref 135–145)

## 2019-01-09 LAB — CULTURE, RESPIRATORY W GRAM STAIN: Culture: NORMAL

## 2019-01-09 MED ORDER — GUAIFENESIN-DM 100-10 MG/5ML PO SYRP: 5.0000 mL | ORAL_SOLUTION | ORAL | 0 refills | Status: DC | PRN

## 2019-01-09 MED ORDER — HYDROCODONE-ACETAMINOPHEN 5-325 MG PO TABS: 1.0000 | ORAL_TABLET | Freq: Four times a day (QID) | ORAL | 0 refills | Status: DC | PRN

## 2019-01-09 NOTE — Progress Notes (Signed)
Discharged to home with a friend. Her Norco prescription was sent to [facility] in [place].  She will monitor her blood pressure twice and day, record the results, and call her physician if it seems to be going up.  She has follow up with Dr. Mike Gip. Dr. Kem Parkinson office will call her.  I also gave her the phone number of the [facility].

## 2019-01-09 NOTE — Discharge Summary (Signed)
[facility] [place] [facility] [place], 73 y.o., DOB 1946-06-19, MRN [id number]. Admission date: 01/06/2019 Discharge Date 01/09/2019 Primary MD Einar Pheasant, MD Admitting Physician Demetrios Loll, MD  Admission Diagnosis  Acute on chronic respiratory failure with hypoxia [facility]) [J96.21]  Discharge Diagnosis   Active Problems:   Acute respiratory failure with hypoxia [facility]) Newly diagnosis adenocarcinoma of the lung lipidic type Hypokalemia Hypertension      Hospital Course  Patient 73 year old who is presenting to the hospital with shortness of breath.  He was seen by pulmonary outpatient and referred for inpatient admission.  Patient underwent a bronchoscopy and biopsy results consistent with adenocarcinoma of the lung.  She was seen by oncology who will follow her up as outpatient for PET scan and further work-up.  Patient also will have home oxygen set up.            Consults  pulmonary/intensive care  Significant Tests:  See full reports for all details     Dg Chest 2 View  Result Date: 01/07/2019 CLINICAL DATA:  Follow-up pneumonia EXAM: CHEST - 2 VIEW COMPARISON:  12/31/18 FINDINGS: Cardiac shadow is within normal limits. The lungs are well aerated bilaterally. Bilateral infiltrates are noted similar to that seen on the prior CT examination with a more consolidative component on the right in the lower lobe. No sizable effusion is seen. No bony abnormality is noted. IMPRESSION: Bilateral infiltrates right greater than left. Electronically Signed   By: Inez Catalina M.D.   On: 01/07/2019 08:40   Ct Chest Wo Contrast  Result Date: 01/01/2019 CLINICAL DATA:  Cough, shortness of breath for 9 months, follow-up abnormal PE study EXAM: CT CHEST WITHOUT CONTRAST TECHNIQUE: Multidetector CT imaging of the chest was performed following the standard protocol without IV contrast. COMPARISON:  11/06/2018 FINDINGS: Cardiovascular: Aortic atherosclerosis.  Three-vessel coronary artery calcifications. Normal heart size. No pericardial effusion. Mediastinum/Nodes: No enlarged mediastinal, hilar, or axillary lymph nodes. Thyroid gland, trachea, and esophagus demonstrate no significant findings. Lungs/Pleura: There is multifocal, very dense, predominantly subpleural ground-glass and consolidative opacity present bilaterally, worst in right lower lobe with near-total consolidation. Findings are significantly worsened, more confluent and dense compared to prior examination dated 11/06/2018, particularly as noted in the right lower lobe. There is mild underlying fibrosis featuring reticular interstitial pulmonary opacity noted in the aerated dependent left lung. There is minimal paraseptal emphysema. Small right pleural effusion, similar to prior examination Upper Abdomen: No acute abnormality.  Colonic diverticulosis. Musculoskeletal: No chest wall mass or suspicious bone lesions identified. IMPRESSION: 1. There is multifocal, very dense, predominantly subpleural ground-glass and consolidative opacity present bilaterally, worst in right lower lobe with near-total consolidation. Findings are significantly worsened, more confluent and dense compared to prior examination dated 11/06/2018, particularly as noted in the right lower lobe. Small right pleural effusion, similar to prior examination. Overall constellation of findings is most consistent with organizing pneumonia with likely underlying etiologies including infection, systemic inflammatory disorder, or drug toxicity. Findings are not typical for COVID-19, although this remains a differential consideration given this pattern of airspace disease. Prior CT examination was negative for pulmonary embolism, which generally excludes pulmonary infarction. 2. There is mild underlying fibrosis featuring reticular interstitial pulmonary opacity noted in the aerated dependent left lung. 3.  There is minimal paraseptal emphysema. 4.   Coronary artery disease and aortic atherosclerosis. Electronically Signed   By: Eddie Candle M.D.   On: 01/01/2019 09:02   Dg Chest [facility] [address]  Result Date: 01/07/2019 CLINICAL DATA:  Status post bronchoscopy. EXAM: PORTABLE CHEST 1 VIEW COMPARISON:  Radiographs of January 06, 2019. FINDINGS: The heart size and mediastinal contours are within normal limits. No pneumothorax is noted. Stable large bilateral lung opacities are noted, right greater than left, consistent with pneumonia. The visualized skeletal structures are unremarkable. IMPRESSION: Stable bilateral lung opacities as described above, consistent with pneumonia. Electronically Signed   By: Marijo Conception M.D.   On: 01/07/2019 14:45   Dg C-arm 1-60 Min  Result Date: 01/07/2019 CLINICAL DATA:  Bronchoscopies. FLUOROSCOPY TIME:  2 minutes and 51 seconds. Images: Eleven EXAM: DG C-ARM 61-120 MIN COMPARISON:  None. FINDINGS: Eleven images were obtained during a right-sided bronchoscopy. IMPRESSION: Eleven images were obtained during a right-sided bronchoscopy. Electronically Signed   By: Dorise Bullion III M.D   On: 01/07/2019 13:57       Today   Subjective:   Christine Mullins patient's breathing is to remain the same Objective:   Blood pressure 103/62, pulse 96, temperature 99 F (37.2 C), temperature source Oral, resp. rate 19, height _0  (1.575 m), weight 65 kg, SpO2 94 %.  .  Intake/Output Summary (Last 24 hours) at 01/09/2019 1457 Last data filed at 01/09/2019 1300 Gross per 24 hour  Intake 363 ml  Output 900 ml  Net -537 ml    Exam VITAL SIGNS: Blood pressure 103/62, pulse 96, temperature 99 F (37.2 C), temperature source Oral, resp. rate 19, height _1  (1.575 m), weight 65 kg, SpO2 94 %.  GENERAL:  73 y.o.-year-old patient lying in the bed with no acute distress.  EYES: Pupils equal, round, reactive to light and accommodation. No scleral icterus. Extraocular muscles intact.  HEENT: Head atraumatic, normocephalic.  Oropharynx and nasopharynx clear.  NECK:  Supple, no jugular venous distention. No thyroid enlargement, no tenderness.  LUNGS: Normal breath sounds bilaterally, no wheezing, rales,rhonchi or crepitation. No use of accessory muscles of respiration.  CARDIOVASCULAR: S1, S2 normal. No murmurs, rubs, or gallops.  ABDOMEN: Soft, nontender, nondistended. Bowel sounds present. No organomegaly or mass.  EXTREMITIES: No pedal edema, cyanosis, or clubbing.  NEUROLOGIC: Cranial nerves II through XII are intact. Muscle strength 5/5 in all extremities. Sensation intact. Gait not checked.  PSYCHIATRIC: The patient is alert and oriented x 3.  SKIN: No obvious rash, lesion, or ulcer.   Data Review     CBC w Diff:  Lab Results  Component Value Date   WBC 10.5 01/07/2019   HGB 13.1 01/07/2019   HCT 38.4 01/07/2019   PLT 279 01/07/2019   LYMPHOPCT 15 01/06/2019   MONOPCT 12 01/06/2019   EOSPCT 1 01/06/2019   BASOPCT 1 01/06/2019   CMP:  Lab Results  Component Value Date   NA 140 01/09/2019   K 3.5 01/09/2019   K 3.8 07/14/2013   CL 107 01/09/2019   CO2 23 01/09/2019   BUN 13 01/09/2019   CREATININE 0.67 01/09/2019   PROT 7.5 01/06/2019   ALBUMIN 3.6 01/06/2019   BILITOT 0.5 01/06/2019   ALKPHOS 101 01/06/2019   AST 18 01/06/2019   ALT 12 01/06/2019  .  Micro Results Recent Results (from the past 240 hour(s))  Novel Coronavirus, NAA (hospital order; send-out to ref lab)     Status: None   Collection Time: 01/03/19  2:08 PM   Specimen: Nasopharyngeal Swab; Respiratory  Result Value Ref Range Status   SARS-CoV-2, NAA NOT DETECTED NOT DETECTED Final    Comment: (NOTE) This test  was developed and its performance characteristics determined by [employer]. This test has not been FDA cleared or approved. This test has been authorized by FDA under an Emergency Use Authorization (EUA). This test is only authorized for the duration of time the declaration that circumstances  exist justifying the authorization of the emergency use of in vitro diagnostic tests for detection of SARS-CoV-2 virus and/or diagnosis of COVID-19 infection under section 564(b)(1) of the Act, 21 U.S.C. 569VXY-8(A)(1), unless the authorization is terminated or revoked sooner. When diagnostic testing is negative, the possibility of a false negative result should be considered in the context of a patient's recent exposures and the presence of clinical signs and symptoms consistent with COVID-19. An individual without symptoms of COVID-19 and who is not shedding SARS-CoV-2 virus would expect to have a negative (not detected) result in this assay. Performed  At: [facility] [place], [place] [id number] Rush Farmer MD MB:[id number]    [place]  Final    Comment: Performed at [facility], [place], [place] [address]  Acid Fast Smear (AFB)     Status: None   Collection Time: 01/07/19  2:39 PM   Specimen: Bronchoalveolar Lavage  Result Value Ref Range Status   AFB Specimen Processing Concentration  Final   Acid Fast Smear Negative  Final    Comment: (NOTE) Performed At: [facility] [address] [place], [place] [id number] Rush Farmer MD QD:[id number]    Source (AFB) BRONCHIAL ALVEOLAR LAVAGE  Final    Comment: Performed at [facility], [address] [place], [place] [address]  Culture, respiratory (non-expectorated)     Status: None   Collection Time: 01/07/19  2:39 PM   Specimen: Bronchoalveolar Lavage; Respiratory  Result Value Ref Range Status   Specimen Description   Final    BRONCHIAL ALVEOLAR LAVAGE Performed at [facility], [address] [place], [place] [address]    Special Requests   Final    BRONCHIAL ALVEOLAR LAVAGE Performed at [facility], [address] [place], [place] [address]    Gram Stain   Final    FEW WBC PRESENT,BOTH PMN AND  MONONUCLEAR NO ORGANISMS SEEN    Culture   Final    RARE Consistent with normal respiratory flora. Performed at [facility], [place] [address] [place], [place] [address]    Report Status 01/09/2019 FINAL  Final  Pneumocystis Smear, DFA     Status: None   Collection Time: 01/07/19  2:39 PM  Result Value Ref Range Status   Pneumocystis Smear, DFA Negative Negative Final    Comment: (NOTE) Performed At: [facility] [place], [place] [id number] Rush Farmer MD NO:[id number]    Source of Sample BRONCHIAL ALVEOLAR LAVAGE  Final    Comment: Performed at [facility], [address] [place], [place] [address]        Code Status Orders  (From admission, onward)         Start     Ordered   01/06/19 1953  Full code  Continuous     01/06/19 1952        Code Status History    This patient has a current code status but no historical code status.   Advance Care Planning Activity    Advance Directive Documentation     Most Recent Value  Type of Advance Directive  Healthcare Power of Attorney, Living will  Pre-existing out of  facility DNR order (yellow form or pink MOST form)  -  "MOST" Form in Place?  -            Discharge Medications   Allergies as of 01/09/2019   No Known Allergies     Medication List    STOP taking these medications   benazepril 10 MG tablet Commonly known as: LOTENSIN   hydrochlorothiazide 25 MG tablet Commonly known as: HYDRODIURIL     TAKE these medications   aspirin EC 81 MG tablet Take 1 tablet (81 mg total) by mouth daily.   diphenhydrAMINE 25 mg capsule Commonly known as: BENADRYL Take 25 mg by mouth daily as needed for allergies.   escitalopram 10 MG tablet Commonly known as: LEXAPRO TAKE 1 TABLET BY MOUTH DAILY   guaiFENesin-dextromethorphan 100-10 MG/5ML syrup Commonly known as: ROBITUSSIN DM Take 5 mLs by mouth every 4 (four) hours as needed for cough.    HYDROcodone-acetaminophen 5-325 MG tablet Commonly known as: NORCO/VICODIN Take 1-2 tablets by mouth every 6 (six) hours as needed for moderate pain.   omeprazole 40 MG capsule Commonly known as: PRILOSEC Take 40 mg by mouth daily.   PROBIOTIC DAILY PO Take 1 capsule by mouth daily.   simvastatin 40 MG tablet Commonly known as: ZOCOR Take 40 mg by mouth daily at 6 PM.   zolpidem 10 MG tablet Commonly known as: AMBIEN TAKE 1/2 TABLET BY MOUTH AT BEDTIME AS NEEDED FOR SLEEP            Durable Medical Equipment  (From admission, onward)         Start     Ordered   01/09/19 1115  DME Oxygen  Once    Question Answer Comment  Length of Need Lifetime   Mode or (Route) Nasal cannula   Liters per Minute 2   Frequency Continuous (stationary and portable oxygen unit needed)   Oxygen delivery system Gas      01/09/19 1114             Total Time in preparing paper work, data evaluation and todays exam - 82 minutes  Dustin Flock M.D on 01/09/2019 at [place]  [phone]

## 2019-01-09 NOTE — Progress Notes (Signed)
   01/09/19 1100  Clinical Encounter Type  Visited With Patient  Visit Type Initial  Ch went in for a visit. Ch and the pt are the members of the same church.  Ch was able to deliver the message of care to the pt on behalf of the church. Pt has shown such a strong and positive spirit in the wake of having received a difficult diagnosis of cancer. Pt is a very strong, and independent person. Pt said that she is grateful for the wonderful life she had. Pt's main focus on her goals of care is to maintain good quality of life and does not want life support machine. Pt is preparing herself for the next few weeks of cancer treatment and is receiving good family/community support. Pt is going home today and is excited about that.

## 2019-01-09 NOTE — TOC Transition Note (Addendum)
Transition of Care [facility]) - CM/SW Discharge Note   Patient Details  Name: Christine Mullins MRN: [id number] Date of Birth: [birth year]  Transition of Care [facility]) CM/SW Contact:  Ronalee Scheunemann, Lenice Llamas Phone Number: [phone]  01/09/2019, 12:07 PM   Clinical Narrative: Patient is medically stable for D/C home today and MD has ordered home oxygen. Clinical Social Worker (CSW) met with patient to set up oxygen. Patient reported that [employer] called her this morning to make sure they got the business. CSW made patient aware that the hospital uses [employer] for oxygen and offered to call [employer] as well. Patient is agreeable to [employer] for oxygen. [facility] [employer] DME agency representative is aware of above and will delivery oxygen today. CSW contacted [employer] at patient's request and made them aware that patient chose another company to provide her oxygen. Please reconsult if future social work needs arise. CSW signing off.       Final next level of care: Home/Self Care Barriers to Discharge: Barriers Resolved   Patient Goals and CMS Choice Patient states their goals for this hospitalization and ongoing recovery are:: To feel better.   Choice offered to / list presented to : Patient  Discharge Placement                       Discharge Plan and Services                DME Arranged: Oxygen DME Agency: [employer] Date DME Agency Contacted: 01/09/19 Time DME Agency Contacted: 96 Representative spoke with at DME Agency: [place] ([place]) Interventions     Readmission Risk Interventions No flowsheet data found.

## 2019-01-09 NOTE — Care Management Important Message (Signed)
Important Message  Patient Details  Name: Christine Mullins MRN: [id number] Date of Birth: [birth year]   Medicare Important Message Given:  Yes  Initial Medicare IM given by Patient Access Associate on 01/09/2019 at 11:59am.  Still valid.   Dannette Barbara 01/09/2019, 12:27 PM

## 2019-01-13 ENCOUNTER — Other Ambulatory Visit: Payer: Self-pay | Admitting: Hematology and Oncology

## 2019-01-13 ENCOUNTER — Ambulatory Visit: Payer: Medicare Other | Admitting: Internal Medicine

## 2019-01-13 ENCOUNTER — Telehealth: Payer: Self-pay | Admitting: Internal Medicine

## 2019-01-13 DIAGNOSIS — C3480 Malignant neoplasm of overlapping sites of unspecified bronchus and lung: Secondary | ICD-10-CM

## 2019-01-13 NOTE — Telephone Encounter (Signed)
Noted. No further action necessary.

## 2019-01-14 ENCOUNTER — Encounter
Admission: RE | Admit: 2019-01-14 | Discharge: 2019-01-14 | Disposition: A | Discharge status: Home / Self Care | Payer: Medicare Other | Source: Ambulatory Visit | Attending: Hematology and Oncology | Admitting: Hematology and Oncology

## 2019-01-14 ENCOUNTER — Other Ambulatory Visit: Payer: Self-pay

## 2019-01-14 DIAGNOSIS — J90 Pleural effusion, not elsewhere classified: Secondary | ICD-10-CM | POA: Insufficient documentation

## 2019-01-14 DIAGNOSIS — Z79899 Other long term (current) drug therapy: Secondary | ICD-10-CM | POA: Diagnosis not present

## 2019-01-14 DIAGNOSIS — K219 Gastro-esophageal reflux disease without esophagitis: Secondary | ICD-10-CM | POA: Diagnosis not present

## 2019-01-14 DIAGNOSIS — C3480 Malignant neoplasm of overlapping sites of unspecified bronchus and lung: Secondary | ICD-10-CM | POA: Diagnosis not present

## 2019-01-14 DIAGNOSIS — E785 Hyperlipidemia, unspecified: Secondary | ICD-10-CM | POA: Insufficient documentation

## 2019-01-14 DIAGNOSIS — Z7982 Long term (current) use of aspirin: Secondary | ICD-10-CM | POA: Insufficient documentation

## 2019-01-14 DIAGNOSIS — I10 Essential (primary) hypertension: Secondary | ICD-10-CM | POA: Diagnosis not present

## 2019-01-14 DIAGNOSIS — C3490 Malignant neoplasm of unspecified part of unspecified bronchus or lung: Secondary | ICD-10-CM | POA: Diagnosis not present

## 2019-01-14 LAB — GLUCOSE, CAPILLARY: Glucose-Capillary: 100 mg/dL — ABNORMAL HIGH (ref 70–99)

## 2019-01-14 MED ORDER — FLUDEOXYGLUCOSE F - 18 (FDG) INJECTION
7.8300 | Freq: Once | INTRAVENOUS | Status: AC | PRN
  Administered 2019-01-14: 7.83 via INTRAVENOUS

## 2019-01-15 ENCOUNTER — Other Ambulatory Visit: Payer: Self-pay

## 2019-01-15 ENCOUNTER — Other Ambulatory Visit: Payer: Self-pay | Admitting: Hematology and Oncology

## 2019-01-15 NOTE — Progress Notes (Signed)
[facility]  [address] [place], [place] [address] Phone: [phone]  Fax: [phone]   Clinic Day:  01/16/2019  Referring physician: Einar Pheasant, MD  Chief Complaint: Christine Mullins is a 73 y.o. female with lepidic adenocarcinoma who is seen for assessment after interval hospitalization.   HPI:  The patient notes a very brief history of smoking in college.  She began to have symptoms about a year ago.  She describes a "bad cold" in 01/2018 that went on for weeks.  CXR on 01/28/2018 revealed restive diffuse interstitial prominence throughout both lungs in a reticular nodular pattern just above chronic lung disease.  She was referred to Dr. Mortimer Fries on 02/09/2019.  She was prescribed a course of antibiotics.  She states that her symptoms initially got better but then waxed and waned.  CXR on 07/30/2018 revealed posterior right lower lobe airspace disease suspicious for pneumonia.  She was again treated with antibiotics and steroids.  Symptoms appeared to improve, but then worsen.  She developed progressive shortness of breath with cough and mucus production in 09/2018.  She noted one episode of emesis in 08/2018 after a prolonged coughing spell.  Chest CT angiogram on 11/06/2018 revealed no pulmonary embolism, but extensive airspace consolidation in the RLL consistent with pneumonia.  There were multiple ill-defined nodular opacities throughout the lungs bilaterally and nearly every lobe.  She was treated with a another course of antibiotics and steroids.  Chest CT on 12/31/2018 revealed multifocal, very dense, predominantly subpleural groundglass and consolidative opacity bilaterally, worse in the right lower lobe with near-total consolidation. There was a small right pleural effusion.  Findings were worse as compared to 11/06/2018.   Pulmonary function testing on 01/01/2019 were unable to be performed because of dyspnea.  Six minute walk revealed a  desaturation to 89%.  COVID-19 testing x 3 was negative.  She was admitted to [facility] from 01/06/2019 - 01/09/2019.  Bronchoscopy on 01/07/2019 by Dr. Merton Border revealed moderate frothy secretions throughout.  There were no endobronchial tumors, masses or foreign bodies.  BAL and cytology was sent from the right middle lobe.  Pathology confirmed lepidic adenocarcinoma.  PET scan on 01/14/2019 revealed extensive airspace and pleural/subpleural disease in both lungs which was markedly hypermetabolic consistent with a clinical history of adenocarcinoma.   There were hypermetabolic lymph nodes identified in the right thoracic inlet and mediastinum consistent with metastatic disease. There was a hypermetabolic lesion in the region of the posteromedial left fourth rib and left T4 transverse process without underlying bony or soft tissue lesion evident on CT. Bony metastatic disease was a concern.  There was a small right pleural effusion.   Symptomatically, the patient is doing "okay". She is accompanied by her friend, Christine Mullins, via phone today. She reports fatigue, and cough with significant mucous production. She has coughing spells throughout the day. She has shortness of breath and is using oxygen via nasal cannula.  She notes being able to do daily chores like cooking and showering. She takes half an Ambien at night to sleep better.  She reports feeling nervous during the visit today.  She notes her diet has improved. Her weight is down 1 pound.   Past Medical History:  Diagnosis Date  . Arthritis   . Depression   . GERD (gastroesophageal reflux disease)   . Hx of diverticulitis of colon   . Hyperlipidemia   . Hypertension     Past Surgical History:  Procedure Laterality Date  .  BREAST BIOPSY Left 1995   negative  . CATARACT EXTRACTION, BILATERAL    . CESAREAN SECTION    . COLONOSCOPY WITH PROPOFOL N/A 05/15/2017   Procedure: COLONOSCOPY WITH PROPOFOL;  Surgeon: Lin Landsman, MD;  Location: [facility] ENDOSCOPY;  Service: Gastroenterology;  Laterality: N/A;  . FLEXIBLE BRONCHOSCOPY Right 01/07/2019   Procedure: FLEXIBLE BRONCHOSCOPY, RIGHT;  Surgeon: Wilhelmina Mcardle, MD;  Location: [facility] ORS;  Service: Pulmonary;  Laterality: Right;  . FOOT SURGERY Bilateral   . GANGLION CYST EXCISION Left 1965   wrist  . GANGLION CYST EXCISION Right 1980   hand  . HYSTEROSCOPY W/D&C N/A 04/30/2017   Procedure: DILATATION AND CURETTAGE /HYSTEROSCOPY;  Surgeon: Defrancesco, Alanda Slim, MD;  Location: [facility] ORS;  Service: Gynecology;  Laterality: N/A;  . KNEE ARTHROSCOPY Right 2000    Family History  Problem Relation Age of Onset  . Arthritis Mother   . Hyperlipidemia Mother   . Peripheral Artery Disease Mother   . Hyperlipidemia Father   . Stroke Father   . Breast cancer Neg Hx   . Ovarian cancer Neg Hx   . Colon cancer Neg Hx     Social History:  reports that she has never smoked. She has never used smokeless tobacco. She reports current alcohol use. She reports that she does not use drugs. She has been single for 34-35 years.  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  She has traveled around the world.  She denies any exposure to radiations or toxins.  Her medical power of attorney is her son, Christine Mullins.  She has a Arts administrator.  She lives alone in [place]. The patient is accompanied by Christine Mullins, her friend, via phone [phone]) today.   Allergies: No Known Allergies  Current Medications: Current Outpatient Medications  Medication Sig Dispense Refill  . aspirin EC 81 MG tablet Take 1 tablet (81 mg total) by mouth daily. 90 tablet 3  . escitalopram (LEXAPRO) 10 MG tablet TAKE 1 TABLET BY MOUTH DAILY 30 tablet 1  . HYDROcodone-acetaminophen (NORCO/VICODIN) 5-325 MG tablet Take 1-2 tablets by mouth every 6 (six) hours as needed for moderate pain. 20 tablet 0  . omeprazole (PRILOSEC) 40 MG capsule Take 40 mg by mouth  daily.     . Probiotic Product (PROBIOTIC DAILY PO) Take 1 capsule by mouth daily.    . simvastatin (ZOCOR) 40 MG tablet Take 40 mg by mouth daily at 6 PM.     . zolpidem (AMBIEN) 10 MG tablet TAKE 1/2 TABLET BY MOUTH AT BEDTIME AS NEEDED FOR SLEEP 30 tablet 0  . diphenhydrAMINE (BENADRYL) 25 mg capsule Take 25 mg by mouth daily as needed for allergies.     No current facility-administered medications for this visit.     Review of Systems  Constitutional: Positive for malaise/fatigue and weight loss (down 1 lb.). Negative for chills and fever.       Doing okay.  HENT: Negative.  Negative for congestion, ear pain, hearing loss, nosebleeds and sore throat.   Eyes: Negative.  Negative for blurred vision, double vision, photophobia and pain.  Respiratory: Positive for cough (significant mucous production) and shortness of breath (on exertion). Negative for hemoptysis.        Hemoptysis x 1 08/2018.  Cardiovascular: Negative.  Negative for chest pain, palpitations, orthopnea, leg swelling and PND.  Gastrointestinal: Negative.  Negative for abdominal pain, blood in stool, constipation, diarrhea, heartburn, nausea and vomiting.  Genitourinary: Negative.  Negative for dysuria, hematuria and urgency.  Musculoskeletal: Negative for back pain, joint pain, myalgias and neck pain.       Arthritis left thumb.  Skin: Negative.  Negative for rash.  Neurological: Negative.  Negative for dizziness, tingling, sensory change, speech change, focal weakness, weakness and headaches.  Endo/Heme/Allergies: Negative.  Does not bruise/bleed easily.  Psychiatric/Behavioral: Negative for depression and memory loss. The patient is nervous/anxious and has insomnia (using Ambien).   All other systems reviewed and are negative.  Performance status (ECOG): 2  Vital Signs Blood pressure 138/71, pulse 74, temperature (!) 97.4 F (36.3 C), temperature source Tympanic, resp. rate 20, height _0  (1.575 m), weight 144 lb  12.8 oz (65.7 kg), SpO2 94 %.   Physical Exam  Constitutional: She is oriented to person, place, and time. She appears well-developed and well-nourished. No distress.  Slightly fatigued appearing woman sitting comfortably in a wheelchair in no acute distress.  She pauses periodically when speaking.  HENT:  Head: Normocephalic and atraumatic.  Mouth/Throat: Oropharynx is clear and moist. No oropharyngeal exudate.  Short gray hair.  Wearing a mask.  [place] in place.  Eyes: Pupils are equal, round, and reactive to light. Conjunctivae and EOM are normal. No scleral icterus.  Blue eyes.  Neck: Normal range of motion. Neck supple. No JVD present.  Cardiovascular: Normal rate, regular rhythm and normal heart sounds.  No murmur heard. Pulmonary/Chest: She has rales in the right lower field.  Coarse breath sounds bilateral lower lobes (right > left).  Intermittent right sided squeaks.  Abdominal: Soft. Bowel sounds are normal. There is no abdominal tenderness.  Musculoskeletal: Normal range of motion.        General: No tenderness or edema.  Lymphadenopathy:    She has no cervical adenopathy.    She has no axillary adenopathy.       Right: No supraclavicular adenopathy present.       Left: No supraclavicular adenopathy present.  Neurological: She is alert and oriented to person, place, and time. She has normal reflexes.  Skin: Skin is warm and dry. She is not diaphoretic.  Psychiatric: She has a normal mood and affect. Her behavior is normal. Judgment and thought content normal.  Nursing note and vitals reviewed.   Hospital Outpatient Visit on 01/14/2019  Component Date Value Ref Range Status  . Glucose-Capillary 01/14/2019 100* 70 - 99 mg/dL Final    Assessment:  SOCORRO KANITZ is a 73 y.o. female with lepidic adenocarcinoma of the lung s/p bronchoscopy on 01/07/2019.  She presented with a 1 year history of progressive shortness of breath and increasing productive cough.  Chest CT on  12/31/2018 revealed multifocal, very dense, predominantly subpleural groundglass and consolidative opacity bilaterally, worse in the right lower lobe with near-total consolidation. There was a small right pleural effusion.   PET scan on 01/14/2019 revealed extensive airspace and pleural/subpleural disease in both lungs which was markedly hypermetabolic consistent with a clinical history of adenocarcinoma.   There were hypermetabolic lymph nodes identified in the right thoracic inlet and mediastinum consistent with metastatic disease. There was a hypermetabolic lesion in the region of the posteromedial left fourth rib and left T4 transverse process without underlying bony or soft tissue lesion evident on CT. Bony metastatic disease was a concern.  There was a small right pleural effusion.   Symptomatically, she has shortness of breath with minimal activity.  Plan: 1.  Stage IV adenocarcinoma of the lung  Review pathology revealing lepidic adenocarcinoma.    Suspect invasive component given PETscan (ie, sampling error).  Review PET scan.  Agree with radiology interpretation.       Extensive disease in chest.   Activity in left 4th rib and left T4 transverse process.   Images reviewed with patient.   Copy of PET scan report provided patient.  Discuss awaiting mutational testing ([facility]) sent last week.             Discuss completing staging with head MRI.  Discuss treatment based on mutational testing.  Discuss consideration of carboplatin and Alimta with pembrolizumab if no driver mutation.   Discuss port placement if IV treatment planned.  Tumor board today.  Discuss consideration of second opinion at [facility] (Dr Durenda Hurt).  Patient agreeable. 2.   Head MRI STAT. 3.   Consult Dr Aniceto Boss. 4.   RTC next week for MD assessment and initiation of treatment.    I discussed the assessment and treatment plan with the patient.  The patient was provided an opportunity to ask  questions and all were answered.  The patient agreed with the plan and demonstrated an understanding of the instructions.  The patient was advised to call back if the symptoms worsen or if the condition fails to improve as anticipated.  I provided 36 minutes of face-to-face time during this this encounter and > 50% was spent counseling as documented under my assessment and plan.     C. Mike Gip, MD, PhD    01/16/2019, 11:07 AM  I, Christine Mullins, am acting as scribe for [place]. Mike Gip, MD, PhD.  I,  C. Mike Gip, MD, have reviewed the above documentation for accuracy and completeness, and I agree with the above.

## 2019-01-16 ENCOUNTER — Ambulatory Visit
Admission: RE | Admit: 2019-01-16 | Discharge: 2019-01-16 | Disposition: A | Discharge status: Home / Self Care | Payer: Medicare Other | Source: Ambulatory Visit | Attending: Hematology and Oncology | Admitting: Hematology and Oncology

## 2019-01-16 ENCOUNTER — Other Ambulatory Visit: Payer: Medicare Other

## 2019-01-16 ENCOUNTER — Encounter: Payer: Self-pay | Admitting: Hematology and Oncology

## 2019-01-16 ENCOUNTER — Inpatient Hospital Stay: Payer: Medicare Other | Attending: Hematology and Oncology | Admitting: Hematology and Oncology

## 2019-01-16 VITALS — BP 138/71 | HR 74 | Temp 97.4°F | Resp 20 | Ht 62.0 in | Wt 144.8 lb

## 2019-01-16 DIAGNOSIS — Z9981 Dependence on supplemental oxygen: Secondary | ICD-10-CM

## 2019-01-16 DIAGNOSIS — C778 Secondary and unspecified malignant neoplasm of lymph nodes of multiple regions: Secondary | ICD-10-CM

## 2019-01-16 DIAGNOSIS — C3490 Malignant neoplasm of unspecified part of unspecified bronchus or lung: Secondary | ICD-10-CM | POA: Diagnosis not present

## 2019-01-16 DIAGNOSIS — C3480 Malignant neoplasm of overlapping sites of unspecified bronchus and lung: Secondary | ICD-10-CM

## 2019-01-16 DIAGNOSIS — J90 Pleural effusion, not elsewhere classified: Secondary | ICD-10-CM

## 2019-01-16 DIAGNOSIS — Z87891 Personal history of nicotine dependence: Secondary | ICD-10-CM

## 2019-01-16 DIAGNOSIS — Z7189 Other specified counseling: Secondary | ICD-10-CM | POA: Insufficient documentation

## 2019-01-16 DIAGNOSIS — G4700 Insomnia, unspecified: Secondary | ICD-10-CM | POA: Insufficient documentation

## 2019-01-16 DIAGNOSIS — C342 Malignant neoplasm of middle lobe, bronchus or lung: Secondary | ICD-10-CM | POA: Insufficient documentation

## 2019-01-16 DIAGNOSIS — R05 Cough: Secondary | ICD-10-CM | POA: Insufficient documentation

## 2019-01-16 DIAGNOSIS — R5383 Other fatigue: Secondary | ICD-10-CM | POA: Diagnosis not present

## 2019-01-16 DIAGNOSIS — M899 Disorder of bone, unspecified: Secondary | ICD-10-CM | POA: Diagnosis not present

## 2019-01-16 DIAGNOSIS — R0602 Shortness of breath: Secondary | ICD-10-CM | POA: Diagnosis not present

## 2019-01-16 MED ORDER — GADOBUTROL 1 MMOL/ML IV SOLN
6.0000 mL | Freq: Once | INTRAVENOUS | Status: AC | PRN
  Administered 2019-01-16: 6 mL via INTRAVENOUS

## 2019-01-16 NOTE — Progress Notes (Signed)
The patient does have SOB with movement and has received oxygen last week.

## 2019-01-16 NOTE — Progress Notes (Signed)
Tumor Board Documentation  Christine Mullins was presented by Dr Mike Gip at our Tumor Board on 01/16/2019, which included representatives from medical oncology, radiation oncology, surgical, radiology, pathology, navigation, internal medicine, palliative care, research.  Christine Mullins currently presents as a new patient, for discussion, for [place], for new positive pathology with history of the following treatments: active survellience, surgical intervention(s).  Additionally, we reviewed previous medical and familial history, history of present illness, and recent lab results along with all available histopathologic and imaging studies. The tumor board considered available treatment options and made the following recommendations: Additional screening Send tissue for molecular testing, MRI  The following procedures/referrals were also placed: No orders of the defined types were placed in this encounter.   Clinical Trial Status: not discussed   Staging used:    AJCC Staging:       Group: Adenocarcinoma Right Middle Lobe Lung   National site-specific guidelines   were discussed with respect to the case.  Tumor board is a meeting of clinicians from various specialty areas who evaluate and discuss patients for whom a multidisciplinary approach is being considered. Final determinations in the plan of care are those of the provider(s). The responsibility for follow up of recommendations given during tumor board is that of the provider.   Today's extended care, comprehensive team conference, Christine Mullins was not present for the discussion and was not examined.   Multidisciplinary Tumor Board is a multidisciplinary case peer review process.  Decisions discussed in the Multidisciplinary Tumor Board reflect the opinions of the specialists present at the conference without having examined the patient.  Ultimately, treatment and diagnostic decisions rest with the primary provider(s) and the patient.

## 2019-01-17 ENCOUNTER — Telehealth: Payer: Self-pay

## 2019-01-17 ENCOUNTER — Encounter: Payer: Self-pay | Admitting: *Deleted

## 2019-01-17 DIAGNOSIS — C3490 Malignant neoplasm of unspecified part of unspecified bronchus or lung: Secondary | ICD-10-CM | POA: Diagnosis not present

## 2019-01-17 DIAGNOSIS — R0602 Shortness of breath: Secondary | ICD-10-CM | POA: Diagnosis not present

## 2019-01-17 DIAGNOSIS — C342 Malignant neoplasm of middle lobe, bronchus or lung: Secondary | ICD-10-CM | POA: Diagnosis not present

## 2019-01-17 NOTE — Telephone Encounter (Signed)
Spoke with the patient to inform her that Dr Mike Gip spoke with Dr Aniceto Boss and he has agreed to see her in office today. I have faxed all new patient information to Dr Aniceto Boss office [phone]. I have called to see if the patient information has been received [phone]. It was received and they have schedule the patient for today at 1:30.

## 2019-01-17 NOTE — Progress Notes (Signed)
  Oncology Nurse Navigator Documentation  Navigator Location: [facility]Med Onc (01/17/19 0800)   )Navigator Encounter Type: Initial MedOnc (01/17/19 0800)   Abnormal Finding Date: 01/01/19 (01/17/19 0800) Confirmed Diagnosis Date: 01/08/19 (01/17/19 0800)                 Treatment Phase: Pre-Tx/Tx Discussion (01/17/19 0800) Barriers/Navigation Needs: Coordination of Care;Education (01/17/19 0800) Education: Newly Diagnosed Cancer Education;Understanding Cancer/ Treatment Options (01/17/19 0800) Interventions: Coordination of Care;Education (01/17/19 0800)   Coordination of Care: Appts;Radiology (01/17/19 0800) Education Method: Verbal;Written (01/17/19 0800)      Acuity: Level 2 (01/17/19 0800)   Acuity Level 2: Initial guidance, education and coordination as needed;Educational needs;Assistance expediting appointments (01/17/19 0800)  met with patient during initial consult with Dr. Mike Gip to discuss PET scan results and treatment options. All questions answered during visit. Pt given resources regarding diagnosis and supportive services available. Reviewed upcoming appts with patient. Instructed to call with any further questions or needs. Pt verbalized understanding. Nothing further needed at this time.   Time Spent with Patient: 60 (01/17/19 0800)

## 2019-01-20 ENCOUNTER — Telehealth: Payer: Self-pay

## 2019-01-20 ENCOUNTER — Telehealth: Payer: Self-pay | Admitting: *Deleted

## 2019-01-20 NOTE — Telephone Encounter (Signed)
  C34.8  Stage IV adenocarcinoma of the lung   M

## 2019-01-20 NOTE — Telephone Encounter (Signed)
-----   Message from Clancy Gourd sent at 01/20/2019  1:08 PM EDT ----- Regarding: [facility] Test on [place],  I received an answering message today at 12:57pm from [place] with [facility] who states that test order form is missing the staging of cancer as well as ICD-10 code, test is on hold, can't proceed without this information. Please call her back at [phone] ext [phone]. Thanks.

## 2019-01-20 NOTE — Telephone Encounter (Signed)
Returned call to [employer] with [facility] regarding missing information on form. Information given and Abby reports test will now proceed. Denies needing any additional info.

## 2019-01-20 NOTE — Telephone Encounter (Signed)
Test on hold because form is missing the stage of her cancer as well as the ICD 10 code

## 2019-01-21 ENCOUNTER — Telehealth: Payer: Self-pay | Admitting: Pulmonary Disease

## 2019-01-21 NOTE — Telephone Encounter (Signed)
Called and spoke to pt, who is requesting refill on hydrocodone 5mg  due to persistent stabbing pain under breast line with deep breath.  Rx last prescribed 01/09/2019 during admission. Pt reports of persistent prod cough with pale yellow mucus. Cough has improved since starting prednisone that was prescribed by [facility], but is still present.   [facility].   DS please advise on refill. Thanks

## 2019-01-22 NOTE — Progress Notes (Signed)
[facility]  [address], [address] [place], [place] [address] Phone: [phone]  Fax: [phone]   Clinic Day:  01/23/2019  Referring physician: Einar Pheasant, MD  Chief Complaint: Christine Mullins is a 73 y.o. female with stage IV adenocarcinoma of the lung who is seen for 1 week assessment.  HPI: The patient was last seen in the medical oncology clinic on 01/16/2019.  At that time, she had shortness of breath with minimal activity.  We discussed treatment for metastatic adenocarcinoma of the lung.  Mutational studies were pending.  We discussed second opinion at [facility]  Head MRI on 01/16/2019 revealed no evidence of metastatic disease. Mild age related volume loss. Minimal small vessel change of the white matter, less than often seen at this age.   The patient was seen by Dr. Aniceto Boss at [facility] on 01/17/2019. Dr. Aniceto Boss sent off ctDNA as a contingency plan if there is insufficient tumor or delays related to NGS.  She was felt to have a 50% chance of having a mutation based on histology and history of never smoking. If no actionable mutation then recommendations were for carboplatin/pemetrexed and pembrolizumab. The patient was started on low dose prednisone for dyspnea.   She received a refill for hydrocodone 5 mg for persistent stabbing pain under breast line with deep breath on 01/21/2019. The patient also reported productive cough with pale yellow mucus. Cough has improved but is still present since starting prednisone that was prescribed by [facility].  During the interim, she has been doing "ok". She reports that she does not like having people do stuff for her. She notes having "good and bad days". She can not go up and down her stairs at home. She is a able to cook for herself and water her flowers.   The patient notes waking up in the middle of the night coughing 1 night ago; she started having pain, so she took her pain medicine. She describes the pain as  "sharp".  She notes she can hear the mucous in her lungs at night, and it sounds like "crackles".   She reports she has to put in extra effort to get up and walk around. She is unable to walk far distances due to shortness of breath. She reports having intermittent involuntary deep breathes, which then causes pain in her chest. She notes coughing with production of mucous .   She notes a tremor in her hands and feeling "jittery" which she attributes to the prednisone.  She expresses fustration because she wished that her cancer could have been caught earlier but she felt dismissed by other doctors.  Her weight is down 4 lbs.    Past Medical History:  Diagnosis Date   Arthritis    Depression    GERD (gastroesophageal reflux disease)    Hx of diverticulitis of colon    Hyperlipidemia    Hypertension     Past Surgical History:  Procedure Laterality Date   BREAST BIOPSY Left 1995   negative   CATARACT EXTRACTION, BILATERAL     CESAREAN SECTION     COLONOSCOPY WITH PROPOFOL N/A 05/15/2017   Procedure: COLONOSCOPY WITH PROPOFOL;  Surgeon: Lin Landsman, MD;  Location: [facility] ENDOSCOPY;  Service: Gastroenterology;  Laterality: N/A;   FLEXIBLE BRONCHOSCOPY Right 01/07/2019   Procedure: FLEXIBLE BRONCHOSCOPY, RIGHT;  Surgeon: Wilhelmina Mcardle, MD;  Location: [facility] ORS;  Service: Pulmonary;  Laterality: Right;   FOOT SURGERY Bilateral    GANGLION CYST EXCISION Left 1965  wrist   GANGLION CYST EXCISION Right 1980   hand   HYSTEROSCOPY W/D&C N/A 04/30/2017   Procedure: DILATATION AND CURETTAGE /HYSTEROSCOPY;  Surgeon: Defrancesco, Alanda Slim, MD;  Location: [facility] ORS;  Service: Gynecology;  Laterality: N/A;   KNEE ARTHROSCOPY Right 2000    Family History  Problem Relation Age of Onset   Arthritis Mother    Hyperlipidemia Mother    Peripheral Artery Disease Mother    Hyperlipidemia Father    Stroke Father    Breast cancer Neg Hx    Ovarian cancer Neg Hx     Colon cancer Neg Hx     Social History:  reports that she has never smoked. She has never used smokeless tobacco. She reports current alcohol use. She reports that she does not use drugs. She has been single for 34-35 years. She previously worked at the [employer] and [employer]. She retired in 2014. [place]. She denies any exposure to radiations or toxins. Her medical power of attorney is her son, Christine Mullins.She has a Arts administrator. She lives alone in [place]. The patient is accompanied by Christine Mullins, her friend, via phone [phone]) today.   Allergies: No Known Allergies  Current Medications: Current Outpatient Medications  Medication Sig Dispense Refill   ACIDOPHILUS LACTOBACILLUS PO Take 1 tablet by mouth daily.      aspirin EC 81 MG tablet Take 1 tablet (81 mg total) by mouth daily. 90 tablet 3   diphenhydrAMINE (BENADRYL) 25 mg capsule Take 25 mg by mouth daily as needed for allergies.     escitalopram (LEXAPRO) 10 MG tablet TAKE 1 TABLET BY MOUTH DAILY 30 tablet 1   HYDROcodone-acetaminophen (NORCO/VICODIN) 5-325 MG tablet Take 1-2 tablets by mouth every 6 (six) hours as needed for moderate pain. 20 tablet 0   omeprazole (PRILOSEC) 40 MG capsule Take 40 mg by mouth daily.      predniSONE (DELTASONE) 10 MG tablet Take 10 mg by mouth daily.      simvastatin (ZOCOR) 40 MG tablet Take 40 mg by mouth daily at 6 PM.      zolpidem (AMBIEN) 10 MG tablet TAKE 1/2 TABLET BY MOUTH AT BEDTIME AS NEEDED FOR SLEEP 30 tablet 0   No current facility-administered medications for this visit.     Review of Systems  Constitutional: Positive for malaise/fatigue and weight loss (4 lbs). Negative for chills and fever.       Doing "ok".  Notes "good and bad days".  HENT: Negative.  Negative for congestion, ear pain, hearing loss, nosebleeds, sinus pain and sore throat.   Eyes: Negative.  Negative for blurred vision, double vision, photophobia and  pain.  Respiratory: Positive for cough (significant mucous production), sputum production and shortness of breath (on exertion). Negative for hemoptysis and wheezing.   Cardiovascular: Negative.  Negative for chest pain, palpitations, orthopnea, leg swelling and PND.  Gastrointestinal: Negative.  Negative for abdominal pain, blood in stool, constipation, diarrhea, heartburn, nausea and vomiting.  Genitourinary: Negative.  Negative for dysuria, hematuria and urgency.  Musculoskeletal: Positive for myalgias (chest, sharp pain). Negative for back pain, joint pain and neck pain.       Arthritis left thumb.  Skin: Negative.  Negative for rash.  Neurological: Positive for tremors (hands with steroids). Negative for dizziness, tingling, sensory change, speech change, focal weakness, weakness and headaches.  Endo/Heme/Allergies: Negative.  Does not bruise/bleed easily.  Psychiatric/Behavioral: Negative for depression and memory loss. The patient is nervous/anxious and has insomnia (using  Ambien).   All other systems reviewed and are negative.  Performance status (ECOG): 2  Vitals Blood pressure 129/73, pulse 97, temperature 99.3 F (37.4 C), temperature source Tympanic, resp. rate (!) 34, weight 140 lb 9.4 oz (63.8 kg), SpO2 96 %.  Physical Exam  Constitutional: She is oriented to person, place, and time. She appears well-developed and well-nourished. No distress.  Woman sitting comfortably in a wheelchair in no acute distress.  She pauses periodically when speaking. Examined in wheelchair.  HENT:  Head: Normocephalic and atraumatic.  Mouth/Throat: Oropharynx is clear and moist. No oropharyngeal exudate.  Short gray hair.  Wearing a mask.  Christine Mullins in place.  Eyes: Pupils are equal, round, and reactive to light. Conjunctivae and EOM are normal. No scleral icterus.  Blue eyes.  Neck: Normal range of motion. Neck supple. No JVD present.  Cardiovascular: Normal rate, regular rhythm and normal heart  sounds.  No murmur heard. Pulmonary/Chest: She has rales in the right lower field.  Coarse breath sounds bilateral lower lobes (right > left).  Intermittent right sided squeaks.  Abdominal: Soft. Bowel sounds are normal. There is no abdominal tenderness.  Musculoskeletal: Normal range of motion.        General: No tenderness or edema.  Neurological: She is alert and oriented to person, place, and time. She has normal reflexes.  Skin: Skin is warm and dry. She is not diaphoretic.  Psychiatric: She has a normal mood and affect. Her behavior is normal. Judgment and thought content normal.  Nursing note and vitals reviewed.    No visits with results within 3 Day(s) from this visit.  Latest known visit with results is:  Hospital Outpatient Visit on 01/14/2019  Component Date Value Ref Range Status   Glucose-Capillary 01/14/2019 100* 70 - 99 mg/dL Final    Assessment:  JERLISA DILIBERTO is a 73 y.o. female with stage IV adenocarcinoma of the lung s/p bronchoscopy on 01/07/2019.  Pathology revealed lepidic adenocarcinoma. She presented with a 1 year history of progressive shortness of breath and increasing productive cough.  Chest CTon 06/23/2020revealed multifocal, very dense, predominantly subpleural groundglass and consolidative opacity bilaterally, worse in the right lower lobe with near-total consolidation.There was a small right pleural effusion.   PET scan on 01/14/2019 revealed extensive airspace and pleural/subpleural disease in both lungs which was markedly hypermetabolic consistent with a clinical history of adenocarcinoma.   There were hypermetabolic lymph nodes identified in the right thoracic inlet and mediastinum consistent with metastatic disease. There was a hypermetabolic lesion in the region of the posteromedial left fourth rib and left T4 transverse process without underlying bony or soft tissue lesion evident on CT. Bony metastatic disease was a concern.  There was a small  right pleural effusion.   Head MRI on 01/16/2019 revealed no evidence of metastatic disease.  Symptomatically, she has "good and bad days".  She has shortness of breath with minimal exertion.  She has a chronic copious productive cough.  She denies any fever.  Plan: 1. Stage IV adenocarcinoma of the lung Patient has stage IV disease.  Head MRI revealed no evidence of metastatic disease.  Discuss plan for treatment as soon as results are back.   ctDNA from [facility] revealed no abnormality.   Await Foundation One testing.   If no mutational abnormality   Carboplatin, Alimta, and pembrolizumab.  If mutational abnormality   Directed treatment.  Given 50% chance of no mutational abnormality (despite non-smoking status)   Discuss preparing for possible carboplatin, Alimta,  and pembrolizumab.    B12 today.    Begin folic acid 1 mg a day.             Discuss port-a-cath versus PIV.   Patient requests PIV for IV access for now.  Discuss second opinion at [facility].  [facility] agrees with plan outlined at last visit. 2.   Cancer-related pain  Rx: hydrocodone with Tylenol. 3.   MD or RN to call patient with results of Foundation One and plan for therapy.  I discussed the assessment and treatment plan with the patient.  The patient was provided an opportunity to ask questions and all were answered.  The patient agreed with the plan and demonstrated an understanding of the instructions.  The patient was advised to call back if the symptoms worsen or if the condition fails to improve as anticipated.  I provided 35 minutes of face-to-face time during this this encounter and > 50% was spent counseling as documented under my assessment and plan.    Lequita Asal, MD, PhD    01/23/2019, 3:07 PM  I, Selena Batten, am acting as scribe for [employer]. Mike Gip, MD, PhD.  I, Callia Swim C. Mike Gip, MD, have reviewed the above documentation for accuracy and completeness, and I agree with the above.

## 2019-01-23 ENCOUNTER — Encounter: Payer: Self-pay | Admitting: Hematology and Oncology

## 2019-01-23 ENCOUNTER — Ambulatory Visit: Payer: Medicare Other

## 2019-01-23 ENCOUNTER — Other Ambulatory Visit: Payer: Self-pay

## 2019-01-23 ENCOUNTER — Inpatient Hospital Stay (HOSPITAL_BASED_OUTPATIENT_CLINIC_OR_DEPARTMENT_OTHER): Payer: Medicare Other | Admitting: Hematology and Oncology

## 2019-01-23 VITALS — BP 129/73 | HR 97 | Temp 99.3°F | Resp 34 | Wt 140.6 lb

## 2019-01-23 DIAGNOSIS — C342 Malignant neoplasm of middle lobe, bronchus or lung: Secondary | ICD-10-CM | POA: Diagnosis not present

## 2019-01-23 DIAGNOSIS — G893 Neoplasm related pain (acute) (chronic): Secondary | ICD-10-CM

## 2019-01-23 DIAGNOSIS — R0602 Shortness of breath: Secondary | ICD-10-CM | POA: Diagnosis not present

## 2019-01-23 DIAGNOSIS — R251 Tremor, unspecified: Secondary | ICD-10-CM

## 2019-01-23 DIAGNOSIS — C3480 Malignant neoplasm of overlapping sites of unspecified bronchus and lung: Secondary | ICD-10-CM

## 2019-01-23 DIAGNOSIS — G4700 Insomnia, unspecified: Secondary | ICD-10-CM | POA: Diagnosis not present

## 2019-01-23 DIAGNOSIS — C778 Secondary and unspecified malignant neoplasm of lymph nodes of multiple regions: Secondary | ICD-10-CM | POA: Diagnosis not present

## 2019-01-23 DIAGNOSIS — M899 Disorder of bone, unspecified: Secondary | ICD-10-CM | POA: Diagnosis not present

## 2019-01-23 DIAGNOSIS — R05 Cough: Secondary | ICD-10-CM | POA: Diagnosis not present

## 2019-01-23 DIAGNOSIS — C3490 Malignant neoplasm of unspecified part of unspecified bronchus or lung: Secondary | ICD-10-CM | POA: Diagnosis not present

## 2019-01-23 DIAGNOSIS — Z0000 Encounter for general adult medical examination without abnormal findings: Secondary | ICD-10-CM

## 2019-01-23 DIAGNOSIS — J90 Pleural effusion, not elsewhere classified: Secondary | ICD-10-CM | POA: Diagnosis not present

## 2019-01-23 MED ORDER — CYANOCOBALAMIN 1000 MCG/ML IJ SOLN
1000.0000 ug | Freq: Once | INTRAMUSCULAR | Status: AC
  Administered 2019-01-23: 16:00:00 1000 ug via INTRAMUSCULAR

## 2019-01-23 MED ORDER — FOLIC ACID 1 MG PO TABS: 1.0000 mg | ORAL_TABLET | Freq: Every day | ORAL | 0 refills | Status: AC

## 2019-01-23 MED ORDER — HYDROCODONE-ACETAMINOPHEN 5-325 MG PO TABS: 1.0000 | ORAL_TABLET | Freq: Four times a day (QID) | ORAL | 0 refills | Status: AC | PRN

## 2019-01-23 NOTE — Patient Instructions (Signed)

## 2019-01-23 NOTE — Progress Notes (Signed)
Pt here for follow up. Reports she needs a refill on hydrocodone that was prescribed by hospitalist. She states she has been out for 2 days.

## 2019-01-23 NOTE — Telephone Encounter (Signed)
I have asked Dr Mike Gip to address this when she sees the patient in the office today  Thanks  Waunita Schooner

## 2019-01-23 NOTE — Telephone Encounter (Signed)
Pt is aware of below message and voiced her understanding. Nothing further is needed. 

## 2019-01-28 DIAGNOSIS — C3490 Malignant neoplasm of unspecified part of unspecified bronchus or lung: Secondary | ICD-10-CM | POA: Diagnosis not present

## 2019-01-30 ENCOUNTER — Telehealth: Payer: Self-pay

## 2019-01-30 NOTE — Telephone Encounter (Signed)
PDL 1 results has been faxed to [facility] [phone] [facility] facility. fax conformation has been confirmed. awaiting for more results to be faxed will not be avablie until 02/04/2019. Ms Neoma Laming has confirmed fax.

## 2019-01-31 ENCOUNTER — Other Ambulatory Visit: Payer: Self-pay | Admitting: Internal Medicine

## 2019-01-31 DIAGNOSIS — Z87891 Personal history of nicotine dependence: Secondary | ICD-10-CM | POA: Diagnosis not present

## 2019-01-31 DIAGNOSIS — G893 Neoplasm related pain (acute) (chronic): Secondary | ICD-10-CM | POA: Diagnosis not present

## 2019-01-31 DIAGNOSIS — Z5111 Encounter for antineoplastic chemotherapy: Secondary | ICD-10-CM | POA: Diagnosis not present

## 2019-01-31 DIAGNOSIS — Z79899 Other long term (current) drug therapy: Secondary | ICD-10-CM | POA: Diagnosis not present

## 2019-01-31 DIAGNOSIS — Z5112 Encounter for antineoplastic immunotherapy: Secondary | ICD-10-CM | POA: Diagnosis not present

## 2019-01-31 DIAGNOSIS — R112 Nausea with vomiting, unspecified: Secondary | ICD-10-CM | POA: Diagnosis not present

## 2019-01-31 DIAGNOSIS — T451X5A Adverse effect of antineoplastic and immunosuppressive drugs, initial encounter: Secondary | ICD-10-CM | POA: Diagnosis not present

## 2019-01-31 DIAGNOSIS — R0609 Other forms of dyspnea: Secondary | ICD-10-CM | POA: Diagnosis not present

## 2019-01-31 DIAGNOSIS — Z9981 Dependence on supplemental oxygen: Secondary | ICD-10-CM | POA: Diagnosis not present

## 2019-01-31 DIAGNOSIS — C342 Malignant neoplasm of middle lobe, bronchus or lung: Secondary | ICD-10-CM | POA: Diagnosis not present

## 2019-02-04 ENCOUNTER — Telehealth: Payer: Self-pay

## 2019-02-04 NOTE — Telephone Encounter (Signed)
Spoke with the patient to inform her that we are still waiting on her mutation results so she can be treated. The patient was understanding and agreeable and was very understanding. The results ETA is July 31/2020.

## 2019-02-05 ENCOUNTER — Other Ambulatory Visit: Payer: Self-pay | Admitting: Hematology and Oncology

## 2019-02-05 LAB — FUNGUS CULTURE WITH STAIN

## 2019-02-05 LAB — FUNGUS CULTURE RESULT

## 2019-02-05 LAB — FUNGAL ORGANISM REFLEX

## 2019-02-06 ENCOUNTER — Encounter: Payer: Self-pay | Admitting: Hematology and Oncology

## 2019-02-07 ENCOUNTER — Ambulatory Visit (INDEPENDENT_AMBULATORY_CARE_PROVIDER_SITE_OTHER): Payer: Medicare Other | Admitting: Internal Medicine

## 2019-02-07 ENCOUNTER — Other Ambulatory Visit: Payer: Self-pay

## 2019-02-07 ENCOUNTER — Encounter: Payer: Self-pay | Admitting: Internal Medicine

## 2019-02-07 DIAGNOSIS — J9601 Acute respiratory failure with hypoxia: Secondary | ICD-10-CM

## 2019-02-07 DIAGNOSIS — I2510 Atherosclerotic heart disease of native coronary artery without angina pectoris: Secondary | ICD-10-CM

## 2019-02-07 DIAGNOSIS — I700 Atherosclerosis of aorta: Secondary | ICD-10-CM

## 2019-02-07 DIAGNOSIS — I10 Essential (primary) hypertension: Secondary | ICD-10-CM | POA: Diagnosis not present

## 2019-02-07 DIAGNOSIS — K219 Gastro-esophageal reflux disease without esophagitis: Secondary | ICD-10-CM

## 2019-02-07 DIAGNOSIS — G479 Sleep disorder, unspecified: Secondary | ICD-10-CM

## 2019-02-07 DIAGNOSIS — C3490 Malignant neoplasm of unspecified part of unspecified bronchus or lung: Secondary | ICD-10-CM | POA: Diagnosis not present

## 2019-02-07 DIAGNOSIS — I2584 Coronary atherosclerosis due to calcified coronary lesion: Secondary | ICD-10-CM

## 2019-02-07 NOTE — Progress Notes (Signed)
Patient ID: Christine Mullins, female   DOB: 06-06-1946, 73 y.o.   MRN: [id number]   Virtual Visit via video Note  This visit type was conducted due to national recommendations for restrictions regarding the COVID-19 pandemic (e.g. social distancing).  This format is felt to be most appropriate for this patient at this time.  All issues noted in this document were discussed and addressed.  No physical exam was performed (except for noted visual exam findings with Video Visits).   I connected with Christine Mullins by a video enabled telemedicine application or telephone and verified that I am speaking with the correct person using two identifiers. Location patient: home Location provider: work  Persons participating in the virtual visit: patient, provider  I discussed the limitations, risks, security and privacy concerns of performing an evaluation and management service by video and the availability of in person appointments. The patient expressed understanding and agreed to proceed.   Reason for visit: scheduled follow up.   HPI: She was recently diagnosed with stage 4 NSCLC - adenocarcinoma.  MRI negative for brain mets.  Seeing Dr Aniceto Boss - [facility].  Received first infusion last week.  Tolerated.  Does report fatigue.  No nausea.  States she is eating.  Has lost 15 pounds.  Trying to stay hydrated.  Has norco if needed.  On oxygen.  Feels her breathing is stable.  No chest pain.  No abdominal pain.  Bowels moving.  Discussed treatment.  Discussed her concerns regarding diagnosis.  Has good support.     ROS: See pertinent positives and negatives per HPI.  Past Medical History:  Diagnosis Date  . Arthritis   . Depression   . GERD (gastroesophageal reflux disease)   . Hx of diverticulitis of colon   . Hyperlipidemia   . Hypertension     Past Surgical History:  Procedure Laterality Date  . BREAST BIOPSY Left 1995   negative  . CATARACT EXTRACTION, BILATERAL    . CESAREAN SECTION    .  COLONOSCOPY WITH PROPOFOL N/A 05/15/2017   Procedure: COLONOSCOPY WITH PROPOFOL;  Surgeon: Lin Landsman, MD;  Location: [facility] ENDOSCOPY;  Service: Gastroenterology;  Laterality: N/A;  . FLEXIBLE BRONCHOSCOPY Right 01/07/2019   Procedure: FLEXIBLE BRONCHOSCOPY, RIGHT;  Surgeon: Wilhelmina Mcardle, MD;  Location: [facility] ORS;  Service: Pulmonary;  Laterality: Right;  . FOOT SURGERY Bilateral   . GANGLION CYST EXCISION Left 1965   wrist  . GANGLION CYST EXCISION Right 1980   hand  . HYSTEROSCOPY W/D&C N/A 04/30/2017   Procedure: DILATATION AND CURETTAGE /HYSTEROSCOPY;  Surgeon: Defrancesco, Alanda Slim, MD;  Location: [facility] ORS;  Service: Gynecology;  Laterality: N/A;  . KNEE ARTHROSCOPY Right 2000    Family History  Problem Relation Age of Onset  . Arthritis Mother   . Hyperlipidemia Mother   . Peripheral Artery Disease Mother   . Hyperlipidemia Father   . Stroke Father   . Breast cancer Neg Hx   . Ovarian cancer Neg Hx   . Colon cancer Neg Hx     SOCIAL HX: reviewed.    Current Outpatient Medications:  .  ACIDOPHILUS LACTOBACILLUS PO, Take 1 tablet by mouth daily. , Disp: , Rfl:  .  aspirin EC 81 MG tablet, Take 1 tablet (81 mg total) by mouth daily., Disp: 90 tablet, Rfl: 3 .  dexamethasone (DECADRON) 4 MG tablet, Take 43m (2 x 428mtablets) by mouth once in AM for 2 days AFTER chemo on cycles 1-4., Disp: , Rfl:  .  diphenhydrAMINE (BENADRYL) 25 mg capsule, Take 25 mg by mouth daily as needed for allergies., Disp: , Rfl:  .  escitalopram (LEXAPRO) 10 MG tablet, TAKE 1 TABLET BY MOUTH DAILY, Disp: 30 tablet, Rfl: 1 .  folic acid (FOLVITE) 1 MG tablet, Take 1 tablet (1 mg total) by mouth daily., Disp: 30 tablet, Rfl: 0 .  HYDROcodone-acetaminophen (NORCO/VICODIN) 5-325 MG tablet, Take 1 tablet by mouth every 6 (six) hours as needed for moderate pain., Disp: 30 tablet, Rfl: 0 .  omeprazole (PRILOSEC) 40 MG capsule, Take 40 mg by mouth daily. , Disp: , Rfl:  .  predniSONE (DELTASONE) 10  MG tablet, Take 10 mg by mouth daily. , Disp: , Rfl:  .  prochlorperazine (COMPAZINE) 10 MG tablet, Take by mouth., Disp: , Rfl:  .  simvastatin (ZOCOR) 40 MG tablet, TAKE 1 TABLET BY MOUTH EVERY EVENING, Disp: 90 tablet, Rfl: 2 .  zolpidem (AMBIEN) 10 MG tablet, TAKE 1/2 TABLET BY MOUTH AT BEDTIME AS NEEDED FOR SLEEP, Disp: 30 tablet, Rfl: 0  EXAM:  VITALS:  Weight 137 pounds.   GENERAL: alert, oriented, appears well and in no acute distress  HEENT: atraumatic, conjunttiva clear, no obvious abnormalities on inspection of external nose and ears  NECK: normal movements of the head and neck  LUNGS: on inspection no signs of respiratory distress, breathing rate appears normal, no obvious gross SOB, gasping or wheezing  CV: no obvious cyanosis  PSYCH/NEURO: pleasant and cooperative, no obvious depression or anxiety, speech and thought processing grossly intact  ASSESSMENT AND PLAN:  Discussed the following assessment and plan:  No problem-specific Assessment & Plan notes found for this encounter.    I discussed the assessment and treatment plan with the patient. The patient was provided an opportunity to ask questions and all were answered. The patient agreed with the plan and demonstrated an understanding of the instructions.   The patient was advised to call back or seek an in-person evaluation if the symptoms worsen or if the condition fails to improve as anticipated.  I provided 40 minutes with pt on video visit discussing her recent diagnosis.  Also discussed her current symptoms and treatment plan.     Einar Pheasant, MD

## 2019-02-09 ENCOUNTER — Encounter: Payer: Self-pay | Admitting: Internal Medicine

## 2019-02-09 DIAGNOSIS — G893 Neoplasm related pain (acute) (chronic): Secondary | ICD-10-CM | POA: Insufficient documentation

## 2019-02-09 DIAGNOSIS — G479 Sleep disorder, unspecified: Secondary | ICD-10-CM | POA: Insufficient documentation

## 2019-02-09 DIAGNOSIS — C3490 Malignant neoplasm of unspecified part of unspecified bronchus or lung: Secondary | ICD-10-CM | POA: Insufficient documentation

## 2019-02-09 MED ORDER — ZOLPIDEM TARTRATE 10 MG PO TABS: ORAL_TABLET | ORAL | 0 refills | Status: DC

## 2019-02-09 NOTE — Assessment & Plan Note (Signed)
Blood pressure has been doing well.  Follow pressures.  Follow metabolic panel.   

## 2019-02-09 NOTE — Assessment & Plan Note (Signed)
She is currently on oxygen.  Breathing is stable. Follow.

## 2019-02-09 NOTE — Assessment & Plan Note (Signed)
Recently diagnosed with  NSCLC.  Seeing Dr Alvin Critchley.  Received her first infusion last week. Doing well.  Some fatigue.  Using oxygen.  Feels things are stable.  Continue f/u with oncology.

## 2019-02-09 NOTE — Assessment & Plan Note (Signed)
Request to continue ambien if needed.  Follow.

## 2019-02-09 NOTE — Assessment & Plan Note (Signed)
Controlled on current regimen.  Follow.  

## 2019-02-09 NOTE — Assessment & Plan Note (Signed)
Hold statin given current clinical issues.

## 2019-02-10 ENCOUNTER — Other Ambulatory Visit: Payer: Self-pay | Admitting: Internal Medicine

## 2019-02-11 ENCOUNTER — Encounter: Payer: Self-pay | Admitting: Hematology and Oncology

## 2019-02-19 LAB — ACID FAST CULTURE WITH REFLEXED SENSITIVITIES (MYCOBACTERIA): Acid Fast Culture: NEGATIVE

## 2019-02-20 DIAGNOSIS — Z87891 Personal history of nicotine dependence: Secondary | ICD-10-CM | POA: Diagnosis not present

## 2019-02-20 DIAGNOSIS — C3491 Malignant neoplasm of unspecified part of right bronchus or lung: Secondary | ICD-10-CM | POA: Diagnosis not present

## 2019-02-20 DIAGNOSIS — C342 Malignant neoplasm of middle lobe, bronchus or lung: Secondary | ICD-10-CM | POA: Diagnosis not present

## 2019-02-20 DIAGNOSIS — Z5111 Encounter for antineoplastic chemotherapy: Secondary | ICD-10-CM | POA: Diagnosis not present

## 2019-02-20 DIAGNOSIS — G893 Neoplasm related pain (acute) (chronic): Secondary | ICD-10-CM | POA: Diagnosis not present

## 2019-02-20 DIAGNOSIS — R112 Nausea with vomiting, unspecified: Secondary | ICD-10-CM | POA: Diagnosis not present

## 2019-02-20 DIAGNOSIS — Z9981 Dependence on supplemental oxygen: Secondary | ICD-10-CM | POA: Diagnosis not present

## 2019-02-20 DIAGNOSIS — R5383 Other fatigue: Secondary | ICD-10-CM | POA: Diagnosis not present

## 2019-02-20 DIAGNOSIS — Z5112 Encounter for antineoplastic immunotherapy: Secondary | ICD-10-CM | POA: Diagnosis not present

## 2019-02-20 DIAGNOSIS — T451X5A Adverse effect of antineoplastic and immunosuppressive drugs, initial encounter: Secondary | ICD-10-CM | POA: Diagnosis not present

## 2019-02-20 DIAGNOSIS — R0609 Other forms of dyspnea: Secondary | ICD-10-CM | POA: Diagnosis not present

## 2019-02-20 DIAGNOSIS — Z79899 Other long term (current) drug therapy: Secondary | ICD-10-CM | POA: Diagnosis not present

## 2019-02-25 ENCOUNTER — Encounter: Payer: Self-pay | Admitting: Hematology and Oncology

## 2019-03-12 DIAGNOSIS — C3491 Malignant neoplasm of unspecified part of right bronchus or lung: Secondary | ICD-10-CM | POA: Diagnosis not present

## 2019-03-12 DIAGNOSIS — G893 Neoplasm related pain (acute) (chronic): Secondary | ICD-10-CM | POA: Diagnosis not present

## 2019-03-13 DIAGNOSIS — R590 Localized enlarged lymph nodes: Secondary | ICD-10-CM | POA: Diagnosis not present

## 2019-03-13 DIAGNOSIS — Z79899 Other long term (current) drug therapy: Secondary | ICD-10-CM | POA: Diagnosis not present

## 2019-03-13 DIAGNOSIS — J90 Pleural effusion, not elsewhere classified: Secondary | ICD-10-CM | POA: Diagnosis not present

## 2019-03-13 DIAGNOSIS — Z5111 Encounter for antineoplastic chemotherapy: Secondary | ICD-10-CM | POA: Diagnosis not present

## 2019-03-13 DIAGNOSIS — N281 Cyst of kidney, acquired: Secondary | ICD-10-CM | POA: Diagnosis not present

## 2019-03-13 DIAGNOSIS — C342 Malignant neoplasm of middle lobe, bronchus or lung: Secondary | ICD-10-CM | POA: Diagnosis not present

## 2019-03-13 DIAGNOSIS — R5383 Other fatigue: Secondary | ICD-10-CM | POA: Diagnosis not present

## 2019-03-13 DIAGNOSIS — R112 Nausea with vomiting, unspecified: Secondary | ICD-10-CM | POA: Diagnosis not present

## 2019-03-13 DIAGNOSIS — R0602 Shortness of breath: Secondary | ICD-10-CM | POA: Diagnosis not present

## 2019-03-13 DIAGNOSIS — R918 Other nonspecific abnormal finding of lung field: Secondary | ICD-10-CM | POA: Diagnosis not present

## 2019-03-13 DIAGNOSIS — K429 Umbilical hernia without obstruction or gangrene: Secondary | ICD-10-CM | POA: Diagnosis not present

## 2019-03-13 DIAGNOSIS — Z23 Encounter for immunization: Secondary | ICD-10-CM | POA: Diagnosis not present

## 2019-03-13 DIAGNOSIS — Z87891 Personal history of nicotine dependence: Secondary | ICD-10-CM | POA: Diagnosis not present

## 2019-03-13 DIAGNOSIS — C3490 Malignant neoplasm of unspecified part of unspecified bronchus or lung: Secondary | ICD-10-CM | POA: Diagnosis not present

## 2019-03-13 DIAGNOSIS — G893 Neoplasm related pain (acute) (chronic): Secondary | ICD-10-CM | POA: Diagnosis not present

## 2019-03-13 DIAGNOSIS — Z5112 Encounter for antineoplastic immunotherapy: Secondary | ICD-10-CM | POA: Diagnosis not present

## 2019-03-13 DIAGNOSIS — R0609 Other forms of dyspnea: Secondary | ICD-10-CM | POA: Diagnosis not present

## 2019-03-13 DIAGNOSIS — R05 Cough: Secondary | ICD-10-CM | POA: Diagnosis not present

## 2019-03-13 DIAGNOSIS — T451X5D Adverse effect of antineoplastic and immunosuppressive drugs, subsequent encounter: Secondary | ICD-10-CM | POA: Diagnosis not present

## 2019-03-25 ENCOUNTER — Other Ambulatory Visit: Payer: Self-pay | Admitting: Internal Medicine

## 2019-03-26 NOTE — Telephone Encounter (Signed)
rx ok'd for ambien 10mg  #30 with no refills.

## 2019-03-26 NOTE — Telephone Encounter (Signed)
Refilled: 02/09/2019 Last OV: 02/07/2019 Next OV: not scheduled

## 2019-04-03 DIAGNOSIS — R05 Cough: Secondary | ICD-10-CM | POA: Diagnosis not present

## 2019-04-03 DIAGNOSIS — R0602 Shortness of breath: Secondary | ICD-10-CM | POA: Diagnosis not present

## 2019-04-03 DIAGNOSIS — Z5111 Encounter for antineoplastic chemotherapy: Secondary | ICD-10-CM | POA: Diagnosis not present

## 2019-04-03 DIAGNOSIS — Z87891 Personal history of nicotine dependence: Secondary | ICD-10-CM | POA: Diagnosis not present

## 2019-04-03 DIAGNOSIS — G893 Neoplasm related pain (acute) (chronic): Secondary | ICD-10-CM | POA: Diagnosis not present

## 2019-04-03 DIAGNOSIS — C3490 Malignant neoplasm of unspecified part of unspecified bronchus or lung: Secondary | ICD-10-CM | POA: Diagnosis not present

## 2019-04-03 DIAGNOSIS — C342 Malignant neoplasm of middle lobe, bronchus or lung: Secondary | ICD-10-CM | POA: Diagnosis not present

## 2019-04-03 DIAGNOSIS — R0609 Other forms of dyspnea: Secondary | ICD-10-CM | POA: Diagnosis not present

## 2019-04-03 DIAGNOSIS — Z5112 Encounter for antineoplastic immunotherapy: Secondary | ICD-10-CM | POA: Diagnosis not present

## 2019-04-03 DIAGNOSIS — R5383 Other fatigue: Secondary | ICD-10-CM | POA: Diagnosis not present

## 2019-04-17 ENCOUNTER — Other Ambulatory Visit: Payer: Self-pay | Admitting: Internal Medicine

## 2019-04-24 DIAGNOSIS — R0602 Shortness of breath: Secondary | ICD-10-CM | POA: Diagnosis not present

## 2019-04-24 DIAGNOSIS — R05 Cough: Secondary | ICD-10-CM | POA: Diagnosis not present

## 2019-04-24 DIAGNOSIS — C3490 Malignant neoplasm of unspecified part of unspecified bronchus or lung: Secondary | ICD-10-CM | POA: Diagnosis not present

## 2019-04-24 DIAGNOSIS — R030 Elevated blood-pressure reading, without diagnosis of hypertension: Secondary | ICD-10-CM | POA: Diagnosis not present

## 2019-04-24 DIAGNOSIS — F17210 Nicotine dependence, cigarettes, uncomplicated: Secondary | ICD-10-CM | POA: Diagnosis not present

## 2019-04-24 DIAGNOSIS — Z9981 Dependence on supplemental oxygen: Secondary | ICD-10-CM | POA: Diagnosis not present

## 2019-04-24 DIAGNOSIS — R112 Nausea with vomiting, unspecified: Secondary | ICD-10-CM | POA: Diagnosis not present

## 2019-04-24 DIAGNOSIS — Z5112 Encounter for antineoplastic immunotherapy: Secondary | ICD-10-CM | POA: Diagnosis not present

## 2019-04-24 DIAGNOSIS — C342 Malignant neoplasm of middle lobe, bronchus or lung: Secondary | ICD-10-CM | POA: Diagnosis not present

## 2019-04-24 DIAGNOSIS — R0609 Other forms of dyspnea: Secondary | ICD-10-CM | POA: Diagnosis not present

## 2019-04-24 DIAGNOSIS — T451X5A Adverse effect of antineoplastic and immunosuppressive drugs, initial encounter: Secondary | ICD-10-CM | POA: Diagnosis not present

## 2019-04-24 DIAGNOSIS — Z5111 Encounter for antineoplastic chemotherapy: Secondary | ICD-10-CM | POA: Diagnosis not present

## 2019-05-08 ENCOUNTER — Other Ambulatory Visit: Payer: Self-pay | Admitting: Internal Medicine

## 2019-05-08 NOTE — Telephone Encounter (Signed)
Last OV 02/07/19 Next OV none  Last refill 03/26/19

## 2019-05-14 NOTE — Telephone Encounter (Signed)
Received notification for refill for ambien.  Should not need yet if takes 1/2 tablet q day.  Please confirm with pt if needs.

## 2019-05-15 DIAGNOSIS — C342 Malignant neoplasm of middle lobe, bronchus or lung: Secondary | ICD-10-CM | POA: Diagnosis not present

## 2019-05-15 DIAGNOSIS — Z5112 Encounter for antineoplastic immunotherapy: Secondary | ICD-10-CM | POA: Diagnosis not present

## 2019-05-15 DIAGNOSIS — C3401 Malignant neoplasm of right main bronchus: Secondary | ICD-10-CM | POA: Diagnosis not present

## 2019-05-15 DIAGNOSIS — Z5111 Encounter for antineoplastic chemotherapy: Secondary | ICD-10-CM | POA: Diagnosis not present

## 2019-05-15 DIAGNOSIS — M899 Disorder of bone, unspecified: Secondary | ICD-10-CM | POA: Diagnosis not present

## 2019-05-15 DIAGNOSIS — Z87891 Personal history of nicotine dependence: Secondary | ICD-10-CM | POA: Diagnosis not present

## 2019-05-16 MED ORDER — ZOLPIDEM TARTRATE 10 MG PO TABS: ORAL_TABLET | ORAL | 0 refills | Status: DC

## 2019-05-16 NOTE — Telephone Encounter (Signed)
Called patient and she says he will try to reduce to just 0.5 tablet and will call next week to schedule appointment ment would not schedule now.

## 2019-05-16 NOTE — Telephone Encounter (Signed)
rx sent in.  Please notify pt to try to take 1/2 tablet q hs and can schedule appt to discuss if needs something else to control her anxiety, etc.

## 2019-05-16 NOTE — Telephone Encounter (Signed)
Patient is taking 0.5 tablet but since having lung cancer, she stated she discussed with you that since she does not take opioids and some nights she cannot sleep she takes a whole tablet patient has been out of medication for 4 days and has had no sleep.

## 2019-05-19 NOTE — Telephone Encounter (Signed)
Please f/u with pt about scheduling a f/u appt.  Thanks.

## 2019-06-02 NOTE — Telephone Encounter (Signed)
Called pt about scheduling f/u. She declined scheduling an appt right now. She stated she is having a lot going on and will call back to schedule

## 2019-06-04 DIAGNOSIS — R0602 Shortness of breath: Secondary | ICD-10-CM | POA: Diagnosis not present

## 2019-06-04 DIAGNOSIS — C3490 Malignant neoplasm of unspecified part of unspecified bronchus or lung: Secondary | ICD-10-CM | POA: Diagnosis not present

## 2019-06-04 DIAGNOSIS — Z9221 Personal history of antineoplastic chemotherapy: Secondary | ICD-10-CM | POA: Diagnosis not present

## 2019-06-04 DIAGNOSIS — R0609 Other forms of dyspnea: Secondary | ICD-10-CM | POA: Diagnosis not present

## 2019-06-04 DIAGNOSIS — J8410 Pulmonary fibrosis, unspecified: Secondary | ICD-10-CM | POA: Diagnosis not present

## 2019-06-04 DIAGNOSIS — Z79899 Other long term (current) drug therapy: Secondary | ICD-10-CM | POA: Diagnosis not present

## 2019-06-04 DIAGNOSIS — Z87891 Personal history of nicotine dependence: Secondary | ICD-10-CM | POA: Diagnosis not present

## 2019-06-04 DIAGNOSIS — R0600 Dyspnea, unspecified: Secondary | ICD-10-CM | POA: Diagnosis not present

## 2019-06-04 DIAGNOSIS — R05 Cough: Secondary | ICD-10-CM | POA: Diagnosis not present

## 2019-06-04 DIAGNOSIS — J479 Bronchiectasis, uncomplicated: Secondary | ICD-10-CM | POA: Diagnosis not present

## 2019-06-06 DIAGNOSIS — C3490 Malignant neoplasm of unspecified part of unspecified bronchus or lung: Secondary | ICD-10-CM | POA: Diagnosis not present

## 2019-06-06 DIAGNOSIS — R0600 Dyspnea, unspecified: Secondary | ICD-10-CM | POA: Diagnosis not present

## 2019-06-10 DIAGNOSIS — C3490 Malignant neoplasm of unspecified part of unspecified bronchus or lung: Secondary | ICD-10-CM | POA: Diagnosis not present

## 2019-06-10 DIAGNOSIS — C3491 Malignant neoplasm of unspecified part of right bronchus or lung: Secondary | ICD-10-CM | POA: Diagnosis not present

## 2019-06-10 DIAGNOSIS — R05 Cough: Secondary | ICD-10-CM | POA: Diagnosis not present

## 2019-06-10 DIAGNOSIS — Z5112 Encounter for antineoplastic immunotherapy: Secondary | ICD-10-CM | POA: Diagnosis not present

## 2019-06-10 DIAGNOSIS — R112 Nausea with vomiting, unspecified: Secondary | ICD-10-CM | POA: Diagnosis not present

## 2019-06-10 DIAGNOSIS — G893 Neoplasm related pain (acute) (chronic): Secondary | ICD-10-CM | POA: Diagnosis not present

## 2019-06-10 DIAGNOSIS — R0602 Shortness of breath: Secondary | ICD-10-CM | POA: Diagnosis not present

## 2019-06-10 DIAGNOSIS — T451X5A Adverse effect of antineoplastic and immunosuppressive drugs, initial encounter: Secondary | ICD-10-CM | POA: Diagnosis not present

## 2019-06-10 DIAGNOSIS — R0600 Dyspnea, unspecified: Secondary | ICD-10-CM | POA: Diagnosis not present

## 2019-06-10 DIAGNOSIS — Z5111 Encounter for antineoplastic chemotherapy: Secondary | ICD-10-CM | POA: Diagnosis not present

## 2019-06-16 DIAGNOSIS — C3490 Malignant neoplasm of unspecified part of unspecified bronchus or lung: Secondary | ICD-10-CM | POA: Diagnosis not present

## 2019-06-16 DIAGNOSIS — C342 Malignant neoplasm of middle lobe, bronchus or lung: Secondary | ICD-10-CM | POA: Diagnosis not present

## 2019-06-16 DIAGNOSIS — C3491 Malignant neoplasm of unspecified part of right bronchus or lung: Secondary | ICD-10-CM | POA: Diagnosis not present

## 2019-07-02 ENCOUNTER — Telehealth: Payer: Self-pay | Admitting: Internal Medicine

## 2019-07-02 NOTE — Telephone Encounter (Signed)
Requesting VF for refill on Ambien-Pt is wanting refill before Christmas.

## 2019-07-02 NOTE — Telephone Encounter (Signed)
Scheduled patient for a virtual tomorrow with pcp, advised it was required for anymore fills. Patient agreed.

## 2019-07-03 ENCOUNTER — Ambulatory Visit (INDEPENDENT_AMBULATORY_CARE_PROVIDER_SITE_OTHER): Payer: Medicare Other | Admitting: Internal Medicine

## 2019-07-03 DIAGNOSIS — I2584 Coronary atherosclerosis due to calcified coronary lesion: Secondary | ICD-10-CM | POA: Diagnosis not present

## 2019-07-03 DIAGNOSIS — C3490 Malignant neoplasm of unspecified part of unspecified bronchus or lung: Secondary | ICD-10-CM | POA: Diagnosis not present

## 2019-07-03 DIAGNOSIS — R05 Cough: Secondary | ICD-10-CM

## 2019-07-03 DIAGNOSIS — R059 Cough, unspecified: Secondary | ICD-10-CM

## 2019-07-03 DIAGNOSIS — I2510 Atherosclerotic heart disease of native coronary artery without angina pectoris: Secondary | ICD-10-CM

## 2019-07-03 DIAGNOSIS — G479 Sleep disorder, unspecified: Secondary | ICD-10-CM | POA: Diagnosis not present

## 2019-07-03 MED ORDER — ZOLPIDEM TARTRATE 10 MG PO TABS: ORAL_TABLET | ORAL | 1 refills | Status: AC

## 2019-07-03 NOTE — Progress Notes (Signed)
Patient ID: Christine Mullins, female   DOB: 02-20-1946, 73 y.o.   MRN: [id number]   Virtual Visit via video Note  This visit type was conducted due to national recommendations for restrictions regarding the COVID-19 pandemic (e.g. social distancing).  This format is felt to be most appropriate for this patient at this time.  All issues noted in this document were discussed and addressed.  No physical exam was performed (except for noted visual exam findings with Video Visits).   I connected with Gennifer Potenza by a video enabled telemedicine application and verified that I am speaking with the correct person using two identifiers. Location patient: home Location provider: work  Persons participating in the virtual visit: patient, provider  The limitations, risks, security and privacy concerns of performing an evaluation and management service by video and the availability of in person appointments have been discussed.   The patient expressed understanding and agreed to proceed.   Reason for visit: scheduled follow up.   HPI: Being followed by oncology for NSCLC - adenocarcinoma.  Was last evaluated by oncology on 06/27/19 for increasing dyspnea and continued cough.  Cough then productive of yellow sputum.  Using nebulizers and norco for dyspnea /cough.  Received first infusion with trastuzumab/pertuzumab 06/27/19.  Also given abx.  She reports cough with minimal ambulation.  Has to sleep sitting up.  Can't lie down.  Has a sleep chair and a chair she used during the day to help with this.  Needs to ambien to help her sleep.  She is eating.  States she has a good appetite.  No nausea.  Staying hydrated.  In good spirits. Family supportive.     ROS: See pertinent positives and negatives per HPI.  Past Medical History:  Diagnosis Date  . Arthritis   . Depression   . GERD (gastroesophageal reflux disease)   . Hx of diverticulitis of colon   . Hyperlipidemia   . Hypertension     Past Surgical  History:  Procedure Laterality Date  . BREAST BIOPSY Left 1995   negative  . CATARACT EXTRACTION, BILATERAL    . CESAREAN SECTION    . COLONOSCOPY WITH PROPOFOL N/A 05/15/2017   Procedure: COLONOSCOPY WITH PROPOFOL;  Surgeon: Lin Landsman, MD;  Location: [facility] ENDOSCOPY;  Service: Gastroenterology;  Laterality: N/A;  . FLEXIBLE BRONCHOSCOPY Right 01/07/2019   Procedure: FLEXIBLE BRONCHOSCOPY, RIGHT;  Surgeon: Wilhelmina Mcardle, MD;  Location: [facility] ORS;  Service: Pulmonary;  Laterality: Right;  . FOOT SURGERY Bilateral   . GANGLION CYST EXCISION Left 1965   wrist  . GANGLION CYST EXCISION Right 1980   hand  . HYSTEROSCOPY WITH D & C N/A 04/30/2017   Procedure: DILATATION AND CURETTAGE /HYSTEROSCOPY;  Surgeon: Defrancesco, Alanda Slim, MD;  Location: [facility] ORS;  Service: Gynecology;  Laterality: N/A;  . KNEE ARTHROSCOPY Right 2000    Family History  Problem Relation Age of Onset  . Arthritis Mother   . Hyperlipidemia Mother   . Peripheral Artery Disease Mother   . Hyperlipidemia Father   . Stroke Father   . Breast cancer Neg Hx   . Ovarian cancer Neg Hx   . Colon cancer Neg Hx     SOCIAL HX: reviewed.    Current Outpatient Medications:  .  ACIDOPHILUS LACTOBACILLUS PO, Take 1 tablet by mouth daily. , Disp: , Rfl:  .  aspirin EC 81 MG tablet, Take 1 tablet (81 mg total) by mouth daily., Disp: 90 tablet, Rfl: 3 .  dexamethasone (DECADRON) 4 MG tablet, Take 51m (2 x 449mtablets) by mouth once in AM for 2 days AFTER chemo on cycles 1-4., Disp: , Rfl:  .  diphenhydrAMINE (BENADRYL) 25 mg capsule, Take 25 mg by mouth daily as needed for allergies., Disp: , Rfl:  .  escitalopram (LEXAPRO) 10 MG tablet, TAKE 1 TABLET BY MOUTH DAILY, Disp: 30 tablet, Rfl: 1 .  folic acid (FOLVITE) 1 MG tablet, Take 1 tablet (1 mg total) by mouth daily., Disp: 30 tablet, Rfl: 0 .  HYDROcodone-acetaminophen (NORCO/VICODIN) 5-325 MG tablet, Take 1 tablet by mouth every 6 (six) hours as needed for moderate  pain., Disp: 30 tablet, Rfl: 0 .  omeprazole (PRILOSEC) 40 MG capsule, Take 40 mg by mouth daily. , Disp: , Rfl:  .  predniSONE (DELTASONE) 10 MG tablet, Take 10 mg by mouth daily. , Disp: , Rfl:  .  prochlorperazine (COMPAZINE) 10 MG tablet, Take by mouth., Disp: , Rfl:  .  simvastatin (ZOCOR) 40 MG tablet, TAKE 1 TABLET BY MOUTH EVERY EVENING, Disp: 90 tablet, Rfl: 2 .  zolpidem (AMBIEN) 10 MG tablet, TAKE 1/2 TABLET AT BEDTIME AS NEEDED FORSLEEP, Disp: 30 tablet, Rfl: 1  EXAM:  GENERAL: alert, oriented, in no acute distress  HEENT: atraumatic, conjunttiva clear, no obvious abnormalities on inspection of external nose and ears  NECK: normal movements of the head and neck  LUNGS: on inspection no signs of acute respiratory distress,  no obvious gross gasping or wheezing  CV: no obvious cyanosis  PSYCH/NEURO: pleasant and cooperative, no obvious depression or anxiety, speech and thought processing grossly intact  ASSESSMENT AND PLAN:  Discussed the following assessment and plan:  Non-small cell lung cancer (NSCLC) (HCC) Seeing Dr StAlvin Critchley Just started new treatment with first infusion 06/27/19.  Has been having increased dyspnea and cough.  Has to sleep sitting up.  Eating well.  Is followed closely by oncology.  Labs through their office.    Difficulty sleeping Discussed with her today.  Has to sleep sitting up.  Does need the ambien to help her sleep.  usually takes 1/2 tablet q hs.  Request refill.  Refill authorized.  Follow.    Cough Being treated and followed by oncology.      I discussed the assessment and treatment plan with the patient. The patient was provided an opportunity to ask questions and all were answered. The patient agreed with the plan and demonstrated an understanding of the instructions.   The patient was advised to call back or seek an in-person evaluation if the symptoms worsen or if the condition fails to improve as anticipated.  I provided 35  minutes of virtual face-to-face time during this encounter.   ChEinar PheasantMD

## 2019-07-07 ENCOUNTER — Encounter: Payer: Self-pay | Admitting: Internal Medicine

## 2019-07-07 NOTE — Assessment & Plan Note (Signed)
Being treated and followed by oncology.

## 2019-07-07 NOTE — Assessment & Plan Note (Signed)
Discussed with her today.  Has to sleep sitting up.  Does need the ambien to help her sleep.  usually takes 1/2 tablet q hs.  Request refill.  Refill authorized.  Follow.

## 2019-07-07 NOTE — Assessment & Plan Note (Signed)
Seeing Dr Alvin Critchley.  Just started new treatment with first infusion 06/27/19.  Has been having increased dyspnea and cough.  Has to sleep sitting up.  Eating well.  Is followed closely by oncology.  Labs through their office.

## 2019-07-21 ENCOUNTER — Other Ambulatory Visit: Payer: Self-pay | Admitting: Internal Medicine

## 2019-07-22 DIAGNOSIS — R0609 Other forms of dyspnea: Secondary | ICD-10-CM | POA: Diagnosis not present

## 2019-07-22 DIAGNOSIS — Z87891 Personal history of nicotine dependence: Secondary | ICD-10-CM | POA: Diagnosis not present

## 2019-07-22 DIAGNOSIS — R197 Diarrhea, unspecified: Secondary | ICD-10-CM | POA: Diagnosis not present

## 2019-07-22 DIAGNOSIS — R350 Frequency of micturition: Secondary | ICD-10-CM | POA: Diagnosis not present

## 2019-07-22 DIAGNOSIS — R05 Cough: Secondary | ICD-10-CM | POA: Diagnosis not present

## 2019-07-22 DIAGNOSIS — Z7952 Long term (current) use of systemic steroids: Secondary | ICD-10-CM | POA: Diagnosis not present

## 2019-07-22 DIAGNOSIS — Z5112 Encounter for antineoplastic immunotherapy: Secondary | ICD-10-CM | POA: Diagnosis not present

## 2019-07-22 DIAGNOSIS — R5383 Other fatigue: Secondary | ICD-10-CM | POA: Diagnosis not present

## 2019-07-22 DIAGNOSIS — C342 Malignant neoplasm of middle lobe, bronchus or lung: Secondary | ICD-10-CM | POA: Diagnosis not present

## 2019-07-22 DIAGNOSIS — Z79899 Other long term (current) drug therapy: Secondary | ICD-10-CM | POA: Diagnosis not present

## 2019-07-22 DIAGNOSIS — I10 Essential (primary) hypertension: Secondary | ICD-10-CM | POA: Diagnosis not present

## 2019-07-24 ENCOUNTER — Other Ambulatory Visit: Payer: Self-pay

## 2019-07-24 ENCOUNTER — Encounter: Payer: Self-pay | Admitting: Internal Medicine

## 2019-07-24 ENCOUNTER — Ambulatory Visit (INDEPENDENT_AMBULATORY_CARE_PROVIDER_SITE_OTHER): Payer: Medicare Other | Admitting: Internal Medicine

## 2019-07-24 DIAGNOSIS — I10 Essential (primary) hypertension: Secondary | ICD-10-CM

## 2019-07-24 DIAGNOSIS — R05 Cough: Secondary | ICD-10-CM | POA: Diagnosis not present

## 2019-07-24 DIAGNOSIS — J9601 Acute respiratory failure with hypoxia: Secondary | ICD-10-CM

## 2019-07-24 DIAGNOSIS — E7800 Pure hypercholesterolemia, unspecified: Secondary | ICD-10-CM | POA: Diagnosis not present

## 2019-07-24 DIAGNOSIS — C3490 Malignant neoplasm of unspecified part of unspecified bronchus or lung: Secondary | ICD-10-CM | POA: Diagnosis not present

## 2019-07-24 DIAGNOSIS — R739 Hyperglycemia, unspecified: Secondary | ICD-10-CM | POA: Diagnosis not present

## 2019-07-24 DIAGNOSIS — K219 Gastro-esophageal reflux disease without esophagitis: Secondary | ICD-10-CM

## 2019-07-24 DIAGNOSIS — R059 Cough, unspecified: Secondary | ICD-10-CM

## 2019-07-24 MED ORDER — ROSUVASTATIN CALCIUM 20 MG PO TABS: 20.0000 mg | ORAL_TABLET | Freq: Every day | ORAL | 2 refills | Status: AC

## 2019-07-24 MED ORDER — AMLODIPINE BESYLATE 5 MG PO TABS: 5.0000 mg | ORAL_TABLET | Freq: Every day | ORAL | 2 refills | Status: AC

## 2019-07-24 MED ORDER — BENAZEPRIL HCL 10 MG PO TABS: 10.0000 mg | ORAL_TABLET | Freq: Every day | ORAL | 1 refills | Status: AC

## 2019-07-24 NOTE — Progress Notes (Signed)
Patient ID: Christine Mullins, female   DOB: 27-Feb-1946, 74 y.o.   MRN: [id number]   Virtual Visit via video Note  This visit type was conducted due to national recommendations for restrictions regarding the COVID-19 pandemic (e.g. social distancing).  This format is felt to be most appropriate for this patient at this time.  All issues noted in this document were discussed and addressed.  No physical exam was performed (except for noted visual exam findings with Video Visits).   I connected with [name] by a video enabled telemedicine application and verified that I am speaking with the correct person using two identifiers. Location patient: home Location provider: work Persons participating in the virtual visit: patient, provider  The limitations, risks, security and privacy concerns of performing an evaluation and management service by telephone and the availability of in person appointments have been discussed.  The patient expressed understanding and agreed to proceed.   Reason for visit: work in appt.   HPI: Followed by oncology for NSCLC - adenocarcinoma.  Receiving new infusion with trastuzumab/pertuzumab.  Feeling better today.  Cough is better.  Slept 6 hours last night.  Eating.  No sore throat. Does have some hoarseness.  Blood pressure elevated.  Oncology started her back on benazepril.  Has been back on benazepril for 6 weeks.  No nausea or vomiting reported.  No abdominal pain reported.  Taking imodium.  Controlling her bowels.     ROS: See pertinent positives and negatives per HPI.  Past Medical History:  Diagnosis Date  . Arthritis   . Depression   . GERD (gastroesophageal reflux disease)   . Hx of diverticulitis of colon   . Hyperlipidemia   . Hypertension     Past Surgical History:  Procedure Laterality Date  . BREAST BIOPSY Left 1995   negative  . CATARACT EXTRACTION, BILATERAL    . CESAREAN SECTION    . COLONOSCOPY WITH PROPOFOL N/A 05/15/2017   Procedure:  COLONOSCOPY WITH PROPOFOL;  Surgeon: Lin Landsman, MD;  Location: [facility] ENDOSCOPY;  Service: Gastroenterology;  Laterality: N/A;  . FLEXIBLE BRONCHOSCOPY Right 01/07/2019   Procedure: FLEXIBLE BRONCHOSCOPY, RIGHT;  Surgeon: Wilhelmina Mcardle, MD;  Location: [facility] ORS;  Service: Pulmonary;  Laterality: Right;  . FOOT SURGERY Bilateral   . GANGLION CYST EXCISION Left 1965   wrist  . GANGLION CYST EXCISION Right 1980   hand  . HYSTEROSCOPY WITH D & C N/A 04/30/2017   Procedure: DILATATION AND CURETTAGE /HYSTEROSCOPY;  Surgeon: Defrancesco, Alanda Slim, MD;  Location: [facility] ORS;  Service: Gynecology;  Laterality: N/A;  . KNEE ARTHROSCOPY Right 2000    Family History  Problem Relation Age of Onset  . Arthritis Mother   . Hyperlipidemia Mother   . Peripheral Artery Disease Mother   . Hyperlipidemia Father   . Stroke Father   . Breast cancer Neg Hx   . Ovarian cancer Neg Hx   . Colon cancer Neg Hx     SOCIAL HX: reviewed.    Current Outpatient Medications:  .  ACIDOPHILUS LACTOBACILLUS PO, Take 1 tablet by mouth daily. , Disp: , Rfl:  .  amLODipine (NORVASC) 5 MG tablet, Take 1 tablet (5 mg total) by mouth daily., Disp: 30 tablet, Rfl: 2 .  aspirin EC 81 MG tablet, Take 1 tablet (81 mg total) by mouth daily., Disp: 90 tablet, Rfl: 3 .  benazepril (LOTENSIN) 10 MG tablet, Take 1 tablet (10 mg total) by mouth daily., Disp: 30 tablet, Rfl: 1 .  dexamethasone (DECADRON) 4 MG tablet, Take 56m (2 x 434mtablets) by mouth once in AM for 2 days AFTER chemo on cycles 1-4., Disp: , Rfl:  .  diphenhydrAMINE (BENADRYL) 25 mg capsule, Take 25 mg by mouth daily as needed for allergies., Disp: , Rfl:  .  escitalopram (LEXAPRO) 10 MG tablet, TAKE 1 TABLET BY MOUTH DAILY, Disp: 30 tablet, Rfl: 1 .  folic acid (FOLVITE) 1 MG tablet, Take 1 tablet (1 mg total) by mouth daily., Disp: 30 tablet, Rfl: 0 .  HYDROcodone-acetaminophen (NORCO/VICODIN) 5-325 MG tablet, Take 1 tablet by mouth every 6 (six) hours as  needed for moderate pain., Disp: 30 tablet, Rfl: 0 .  omeprazole (PRILOSEC) 40 MG capsule, Take 40 mg by mouth daily. , Disp: , Rfl:  .  predniSONE (DELTASONE) 10 MG tablet, Take 10 mg by mouth daily. , Disp: , Rfl:  .  prochlorperazine (COMPAZINE) 10 MG tablet, Take by mouth., Disp: , Rfl:  .  rosuvastatin (CRESTOR) 20 MG tablet, Take 1 tablet (20 mg total) by mouth daily., Disp: 30 tablet, Rfl: 2 .  zolpidem (AMBIEN) 10 MG tablet, TAKE 1/2 TABLET AT BEDTIME AS NEEDED FORSLEEP, Disp: 30 tablet, Rfl: 1  EXAM:  VITALS per patient if applicable: 17124/58GENERAL: alert, oriented, appears well and in no acute distress  HEENT: atraumatic, conjunttiva clear, no obvious abnormalities on inspection of external nose and ears  NECK: normal movements of the head and neck  LUNGS: on inspection no signs of respiratory distress, breathing rate appears normal, no obvious gross SOB, gasping or wheezing  CV: no obvious cyanosis  PSYCH/NEURO: pleasant and cooperative, no obvious depression or anxiety, speech and thought processing grossly intact  ASSESSMENT AND PLAN:  Discussed the following assessment and plan:  Cough Cough is some better today.  Being treated by oncology as outlined.    Essential hypertension Blood pressure elevated.  Add amlodipine 43m27m day.  Continue benazepril.  Follow pressures.  Follow metabolic panel.   GERD (gastroesophageal reflux disease) Controlled.    Hypercholesterolemia Since starting amlodipine, will change simvastatin to crestor (to avoid interaction of high dose simvastatin and amlodipine).  Low cholesterol diet and exercise.  Follow lipid panel and liver function tests.   Hyperglycemia Low carb diet and exercise.  Follow met b and a1c.    [facility].  Undergoing new treatment as outlined.  Follow.    Acute respiratory failure with hypoxia (HCC) Currently on oxygen.  Follow.      Meds ordered this encounter  Medications  . rosuvastatin (CRESTOR) 20 MG tablet    Sig: Take 1 tablet (20 mg total) by mouth daily.    Dispense:  30 tablet    Refill:  2  . amLODipine (NORVASC) 5 MG tablet    Sig: Take 1 tablet (5 mg total) by mouth daily.    Dispense:  30 tablet    Refill:  2  . benazepril (LOTENSIN) 10 MG tablet    Sig: Take 1 tablet (10 mg total) by mouth daily.    Dispense:  30 tablet    Refill:  1     I discussed the assessment and treatment plan with the patient. The patient was provided an opportunity to ask questions and all were answered. The patient agreed with the plan and demonstrated an understanding of the instructions.   The patient was advised to call back or seek an in-person evaluation if the symptoms  worsen or if the condition fails to improve as anticipated.   Einar Pheasant, MD

## 2019-07-25 ENCOUNTER — Telehealth: Payer: Self-pay | Admitting: Internal Medicine

## 2019-07-25 NOTE — Telephone Encounter (Signed)
Pt was returning a call about medication.

## 2019-07-25 NOTE — Telephone Encounter (Signed)
Please call her and let her know that I want her to continue the lotensin she was taking (10mg ) in the am and I added amlodipine - to take in the evening.  Let me if any other questions.

## 2019-07-25 NOTE — Telephone Encounter (Signed)
Patient aware of results. She is also wondering if we can renew her handicap card. Pulmonary was doing it but she does not want to see them

## 2019-07-25 NOTE — Telephone Encounter (Signed)
See mychart.  

## 2019-07-25 NOTE — Telephone Encounter (Signed)
I am ok to sign handicap form

## 2019-07-27 ENCOUNTER — Encounter: Payer: Self-pay | Admitting: Internal Medicine

## 2019-07-27 NOTE — Assessment & Plan Note (Signed)
Since starting amlodipine, will change simvastatin to crestor (to avoid interaction of high dose simvastatin and amlodipine).  Low cholesterol diet and exercise.  Follow lipid panel and liver function tests.

## 2019-07-27 NOTE — Assessment & Plan Note (Signed)
Blood pressure elevated.  Add amlodipine 5mg  q day.  Continue benazepril.  Follow pressures.  Follow metabolic panel.

## 2019-07-27 NOTE — Assessment & Plan Note (Signed)
Cough is some better today.  Being treated by oncology as outlined.

## 2019-07-27 NOTE — Assessment & Plan Note (Signed)
Low carb diet and exercise.  Follow met b and a1c.   

## 2019-07-27 NOTE — Assessment & Plan Note (Signed)
Being followed by Dr Stitchcombe/oncology.  Undergoing new treatment as outlined.  Follow.

## 2019-07-27 NOTE — Assessment & Plan Note (Signed)
Controlled.  

## 2019-07-27 NOTE — Assessment & Plan Note (Signed)
Currently on oxygen.  Follow.

## 2019-07-28 NOTE — Telephone Encounter (Signed)
Signed and placed in basket

## 2019-07-28 NOTE — Telephone Encounter (Signed)
Handicap form filled out and placed in quick sign.

## 2019-08-01 NOTE — Telephone Encounter (Signed)
Blood pressures are still running a little high.  Confirm she is doing ok.  Is her cough still better.  Last I talked with her cough was better.  If any problems, let me know.  She is on benazepril 10mg  q day.  Confirm.  If so, then have her increase to 20mg  q day.  Continue amlodipine 5mg  q day.  Follow pressures and send in readings.

## 2019-08-01 NOTE — Telephone Encounter (Signed)
Pt doing ok. Cough is better. She is going to increase benazepril to 20 mg q day and continue amlodipine 5 mg q day and send in readings next week.

## 2019-08-06 ENCOUNTER — Encounter: Payer: Self-pay | Admitting: Internal Medicine

## 2019-08-07 NOTE — Telephone Encounter (Signed)
Please call pt.  Blood pressures look good.  Pulse rate is a little high at times.  This may be related to her underlying lung issue.  Blood pressures are much improved.  Continue to spot check and let us know if any problems.  Confirm breathing stable and no acute problems.

## 2019-08-07 NOTE — Telephone Encounter (Signed)
Patient aware. Confirmed doing ok. Patient will continue current dose and spot check pressures

## 2019-08-11 DEATH — deceased

## 2019-09-01 ENCOUNTER — Telehealth: Payer: Self-pay

## 2019-09-01 NOTE — Telephone Encounter (Signed)
[facility] staff was calling to confirm appt but per note in care everywhere, sheriff notified her oncologist that pt is deceased. Family confirmed. Changed to deceased in chart.

## 2019-09-04 ENCOUNTER — Ambulatory Visit: Payer: Medicare Other | Admitting: Internal Medicine

## 2019-09-08 DEATH — deceased

## 2021-05-14 IMAGING — CT NUCLEAR MEDICINE PET IMAGE INITIAL (PI) SKULL BASE TO THIGH
1 of 3 series · 8 of 30 positions shown, 10 images · non-contrast
Comparison: Chest CT 12/31/2018

CLINICAL DATA: Initial treatment strategy for lung adenocarcinoma.

EXAM:
NUCLEAR MEDICINE PET SKULL BASE TO THIGH
TECHNIQUE: 7.8 mCi F-18 FDG was injected intravenously. Full-ring PET imaging
was performed from the skull base to thigh after the radiotracer. CT
data was obtained and used for attenuation correction and anatomic
localization.
Fasting blood glucose: 100 mg/dl

[Series 4: ct wb 5.0 b30f · axial · 0.98mm/px · z∈[-982,-310]mm · 8 of 290 slices shown, 10 images]
[im 33/290  brain]
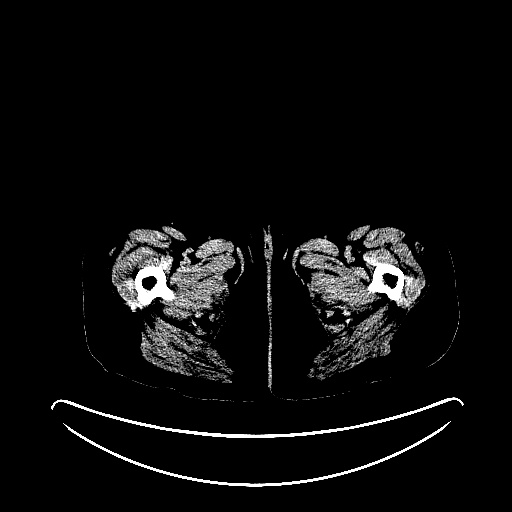
[im 33/290  bone]
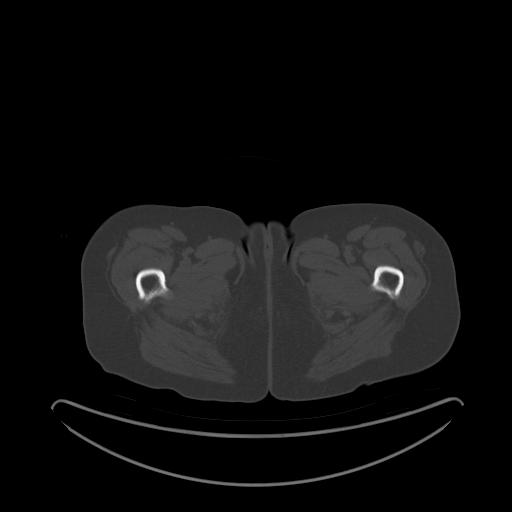
[im 65/290  brain]
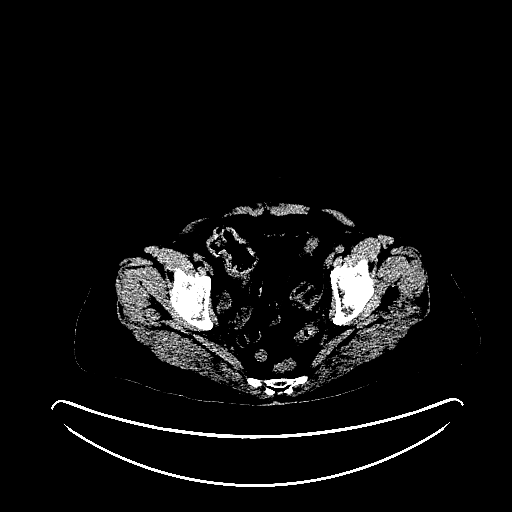
[im 97/290  brain]
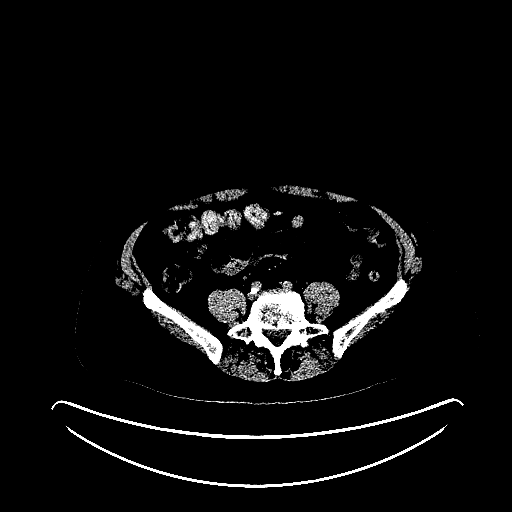
[im 129/290  brain]
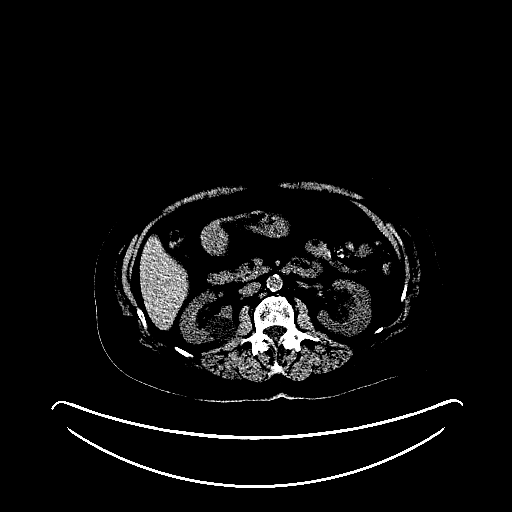
[im 161/290  brain]
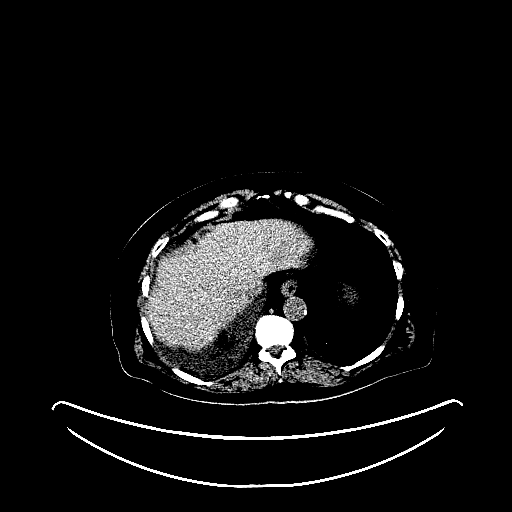
[im 161/290  bone]
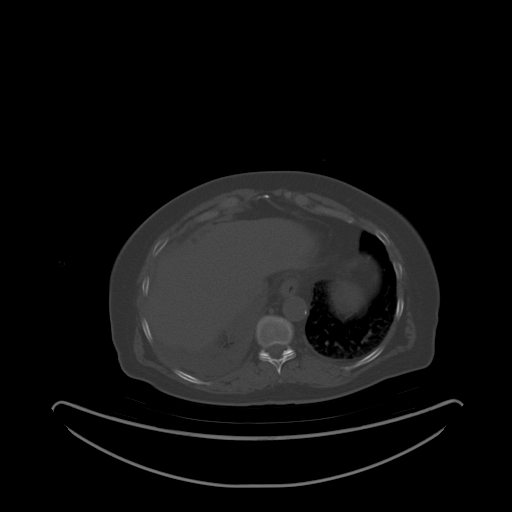
[im 193/290  brain]
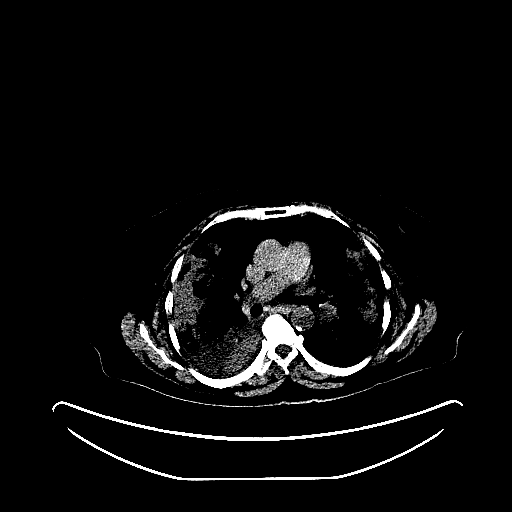
[im 225/290  brain]
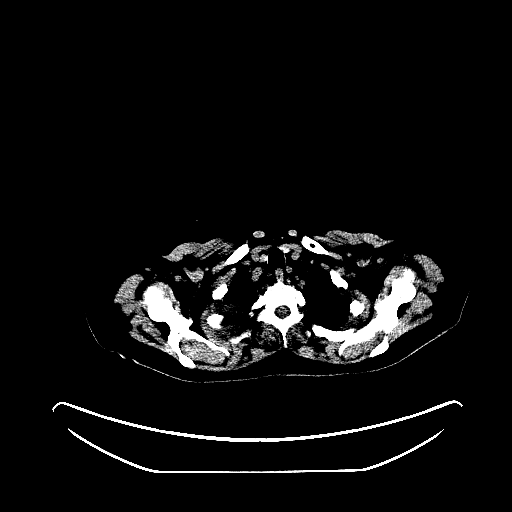
[im 257/290  brain]
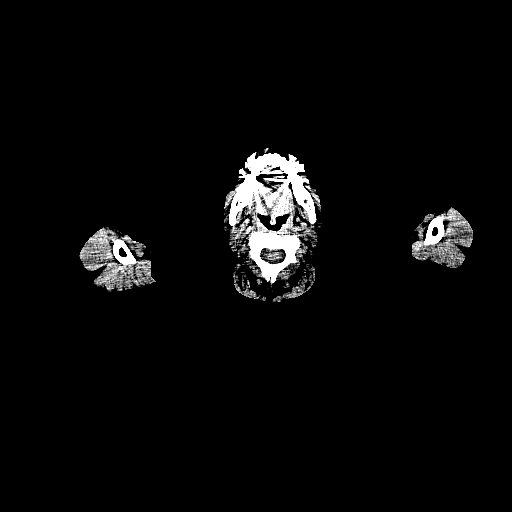

[8 of 30 positions shown; findings below may reference images not displayed]

FINDINGS: Mediastinal blood pool activity: SUV max

Liver activity: SUV max NA

NECK: No hypermetabolic lymph nodes in the neck.

Incidental CT findings: none

CHEST: The peripheral areas of airspace disease in both lungs are
markedly hypermetabolic with SUV max = 19.2 on the right and 19.6 on
the left.

8 mm short axis right thoracic inlet node (65/4) is hypermetabolic
with SUV max = 3.3. 12 mm short axis precarinal node is
hypermetabolic with SUV max = 4.9.

Incidental CT findings: Coronary artery calcification is evident.
Atherosclerotic calcification is noted in the wall of the thoracic
aorta. Small right pleural effusion noted.

ABDOMEN/PELVIS: No abnormal hypermetabolic activity within the
liver, pancreas, adrenal glands, or spleen. No hypermetabolic lymph
nodes in the abdomen or pelvis.

Incidental CT findings: 3.2 cm cyst identified in the upper
interpolar region of the right kidney. There is abdominal aortic
atherosclerosis without aneurysm. Diffuse diverticular changes noted
in the colon.

SKELETON: Focal hypermetabolism is identified in the region of the
posterior left fourth rib and left fourth thoracic transverse
process. SUV max = 5. No underlying bony or soft tissue lesion
evident on CT imaging.

Incidental CT findings: none
IMPRESSION: 1. The extensive airspace and pleural/subpleural disease in both
lungs is markedly hypermetabolic consistent with the reported
clinical history of adenocarcinoma.
2. Hypermetabolic lymph nodes identified in the right thoracic inlet
and mediastinum consistent with metastatic disease.
3. Hypermetabolic lesion identified in the region of the
posteromedial left fourth rib and left T4 transverse process without
underlying bony or soft tissue lesion evident on CT. Bony metastatic
disease a concern.
4. Small right pleural effusion.
5.  Aortic Atherosclerois ([id number]-170.0)
